# Patient Record
Sex: Female | Born: 1995 | Race: White | Hispanic: No | Smoking: Former smoker
Health system: Southern US, Community
[De-identification: ages and names within clinical notes are randomized; demographics above are authoritative.]

## PROBLEM LIST (undated history)

## (undated) DIAGNOSIS — G4730 Sleep apnea, unspecified: Secondary | ICD-10-CM

## (undated) DIAGNOSIS — N926 Irregular menstruation, unspecified: Principal | ICD-10-CM

## (undated) HISTORY — DX: Sleep apnea, unspecified: G47.30

## (undated) HISTORY — PX: ABDOMINAL SURGERY: SHX537

## (undated) HISTORY — PX: BREAST SURGERY: SHX581

## (undated) HISTORY — PX: TONSILLECTOMY: SUR1361

## (undated) HISTORY — PX: APPENDECTOMY: SHX54

## (undated) HISTORY — DX: Irregular menstruation, unspecified: N92.6

## (undated) HISTORY — PX: NASAL SINUS SURGERY: SHX719

---
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
## 2013-01-06 ENCOUNTER — Ambulatory Visit: Payer: 59

## 2013-01-28 ENCOUNTER — Ambulatory Visit: Payer: 59 | Admitting: Family Medicine

## 2013-03-25 ENCOUNTER — Encounter: Payer: Self-pay | Admitting: Family Medicine

## 2013-03-25 ENCOUNTER — Ambulatory Visit (INDEPENDENT_AMBULATORY_CARE_PROVIDER_SITE_OTHER): Payer: 59 | Admitting: Family Medicine

## 2013-03-25 VITALS — BP 128/72 | Ht 70.0 in | Wt 133.0 lb

## 2013-03-25 DIAGNOSIS — R319 Hematuria, unspecified: Secondary | ICD-10-CM

## 2013-03-25 DIAGNOSIS — N200 Calculus of kidney: Secondary | ICD-10-CM

## 2013-03-25 LAB — POCT URINALYSIS DIPSTICK
PH UA: 6
Spec Grav, UA: 1.01

## 2013-03-25 MED ORDER — ONDANSETRON 4 MG PO TBDP: 4.0000 mg | ORAL_TABLET | Freq: Three times a day (TID) | ORAL | Status: DC | PRN

## 2013-03-25 MED ORDER — CIPROFLOXACIN HCL 500 MG PO TABS: 500.0000 mg | ORAL_TABLET | Freq: Two times a day (BID) | ORAL | Status: DC

## 2013-03-25 MED ORDER — HYDROCODONE-ACETAMINOPHEN 5-325 MG PO TABS: 1.0000 | ORAL_TABLET | ORAL | Status: DC | PRN

## 2013-03-25 MED ORDER — DICLOFENAC SODIUM 75 MG PO TBEC: 75.0000 mg | DELAYED_RELEASE_TABLET | Freq: Two times a day (BID) | ORAL | Status: DC

## 2013-03-25 NOTE — Progress Notes (Signed)
   Subjective:    Patient ID: Sheila Fields, female    DOB: 06/06/1995, 18 y.o.   MRN: [id number]  HPI Patient arrives with complaint of pelvic pain for about a week - worse yesterday with hematuria. Patient has history of kidney stone and states it felt like the pain she is currently experiencing.  Had prior kid stone--this feels a lot like it patient had history of prior stone. She never required surgery or interventions before. That was on her left side. Was told at that time scan refilled the stone on the right side.  Review full records reveals old scar scan refilled 3 mm stone at right kidney region.  Some increase frequency possible slight chills right flank pain.  Nausea at times with the pain. At times the pain is severe.  No fam hx  Comm college this fall,  Review of Systems No high fevers no frank vomiting no chills no abdominal bowel changes ROS otherwise negative    Objective:   Physical Exam  Alert moderate malaise vitals stable HEENT normal right CVA tenderness lungs clear. Heart regular in rhythm. Abdomen good bowel sounds right lateral and lower abdominal tenderness to deep palpation  Urinalysis too numerous to count red blood cells and occasional white blood cell.      Assessment & Plan:  Impression probable right renal stone in ureter with possible secondary infection discussed at length. Hold off a mega-workup for now. Rationale discussed. Plan hydrocodone when necessary for pain. Cipro twice a day as prescribed. Zofran when necessary. Increase liquids. Screening urine bring in stone if available. WSL

## 2013-03-27 DIAGNOSIS — N200 Calculus of kidney: Secondary | ICD-10-CM | POA: Insufficient documentation

## 2013-03-27 DIAGNOSIS — R319 Hematuria, unspecified: Secondary | ICD-10-CM | POA: Insufficient documentation

## 2013-04-19 ENCOUNTER — Telehealth: Payer: Self-pay | Admitting: *Deleted

## 2013-04-19 DIAGNOSIS — N200 Calculus of kidney: Secondary | ICD-10-CM

## 2013-04-19 NOTE — Telephone Encounter (Signed)
Patient dropped off kidney stone. Do you want to send to lab. She was seen on 03/25/13 for kidney stone.

## 2013-04-19 NOTE — Telephone Encounter (Signed)
Sent to lab

## 2013-04-19 NOTE — Telephone Encounter (Signed)
yes

## 2013-04-23 LAB — STONE ANALYSIS: Stone Weight KSTONE: 0.034 g

## 2013-04-27 NOTE — Telephone Encounter (Signed)
Patient notified and verbalized understanding. 

## 2013-05-14 ENCOUNTER — Encounter: Payer: Self-pay | Admitting: *Deleted

## 2014-01-09 ENCOUNTER — Encounter: Payer: Self-pay | Admitting: *Deleted

## 2014-04-18 IMAGING — CT CT ABD-PELV W/O CM
2 of 4 series · 17 of 46 positions shown, 19 images · non-contrast
Comparison: 11/29/2007

CLINICAL DATA: Left flank pain for 4 days, constipation, nausea

CT ABDOMEN AND PELVIS WITHOUT CONTRAST
TECHNIQUE: Multidetector CT imaging of the abdomen and pelvis was
performed following the standard protocol without intravenous
contrast. Sagittal and coronal MPR images reconstructed from axial
data set.

[Series 2: stone 130 5.0 b31f st · axial · 0.62mm/px · z∈[-436,-66]mm · 14 of 80 slices shown, 16 images]
[im 3/80  soft-tissue]
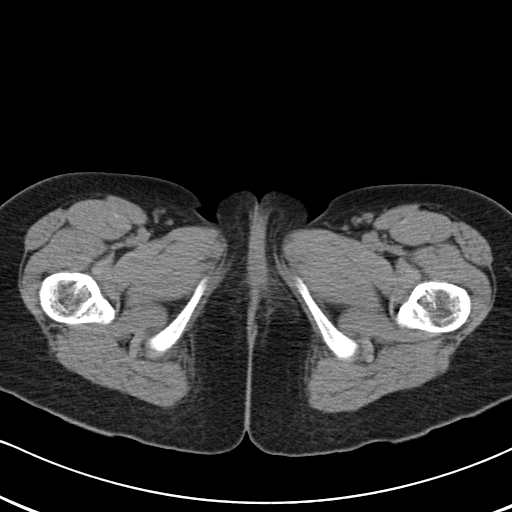
[im 3/80  bone]
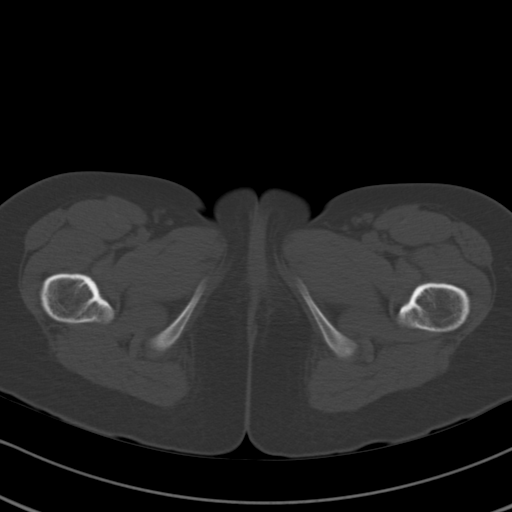
[im 9/80  soft-tissue]
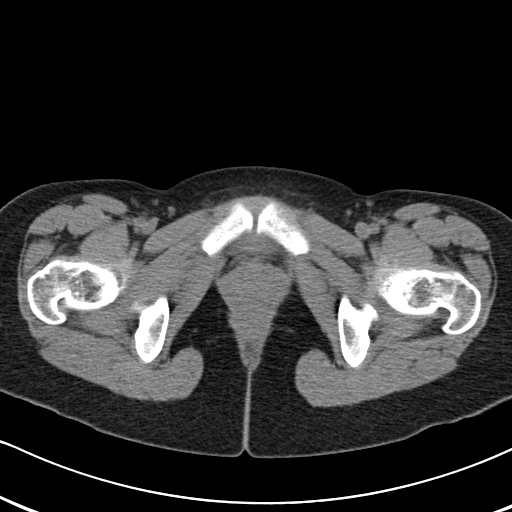
[im 15/80  soft-tissue]
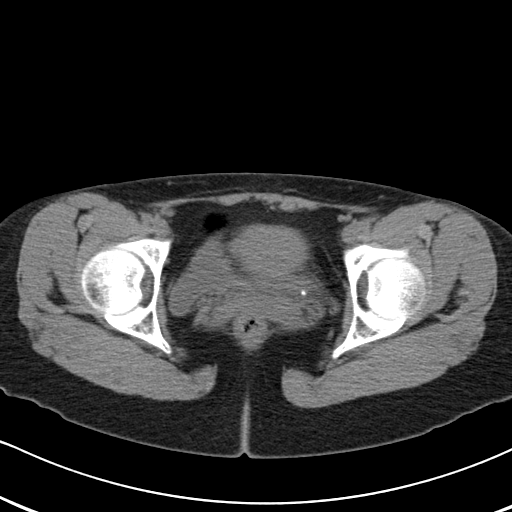
[im 21/80  soft-tissue]
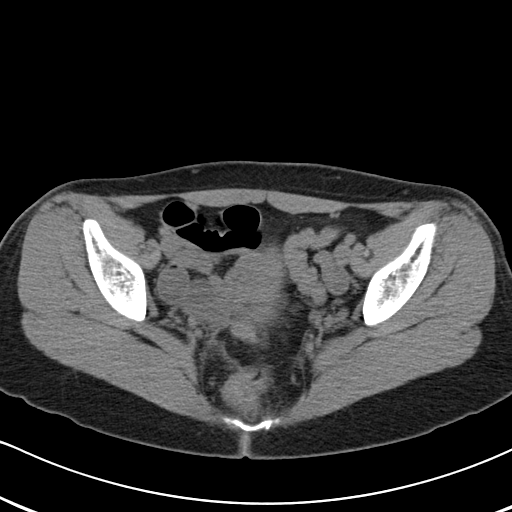
[im 27/80  soft-tissue]
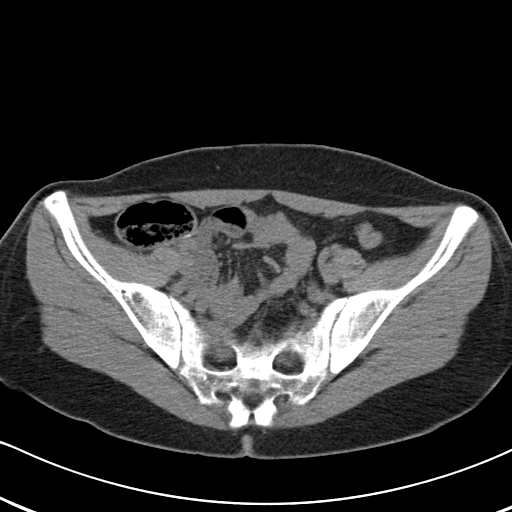
[im 33/80  soft-tissue]
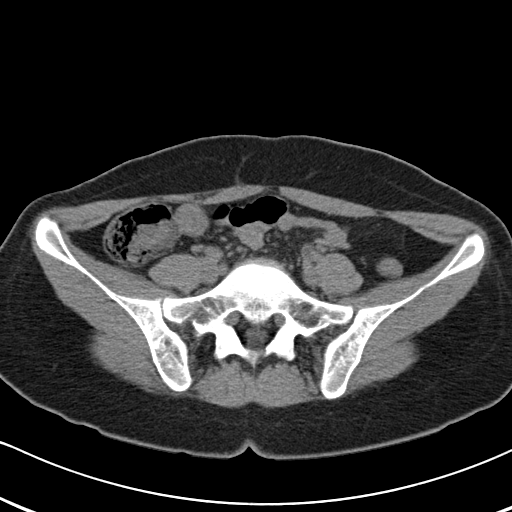
[im 39/80  soft-tissue]
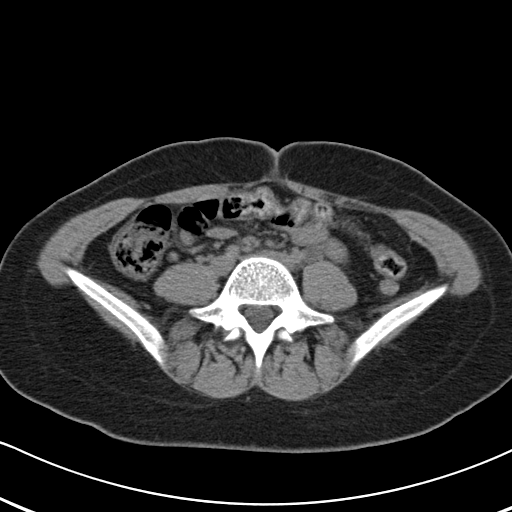
[im 41/80  soft-tissue]
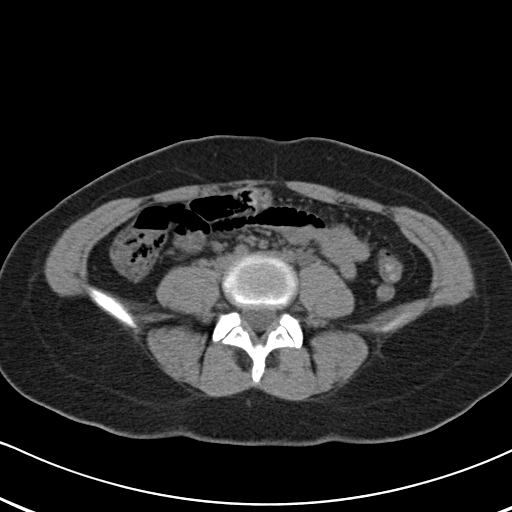
[im 47/80  soft-tissue]
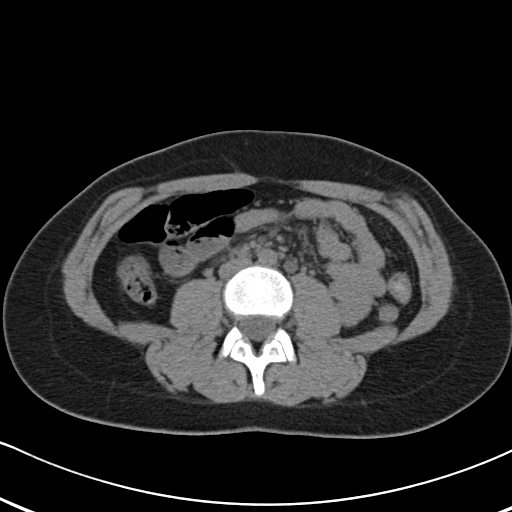
[im 47/80  bone]
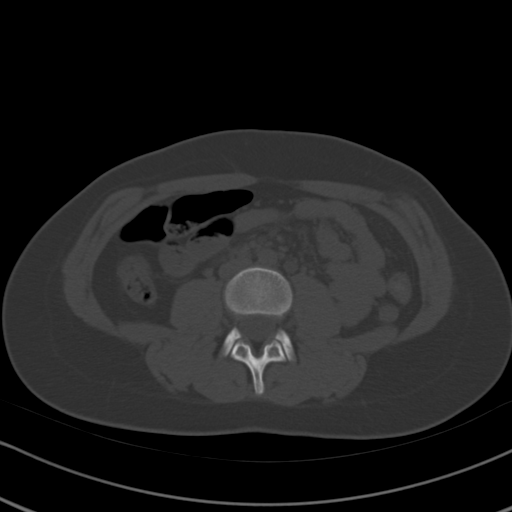
[im 53/80  soft-tissue]
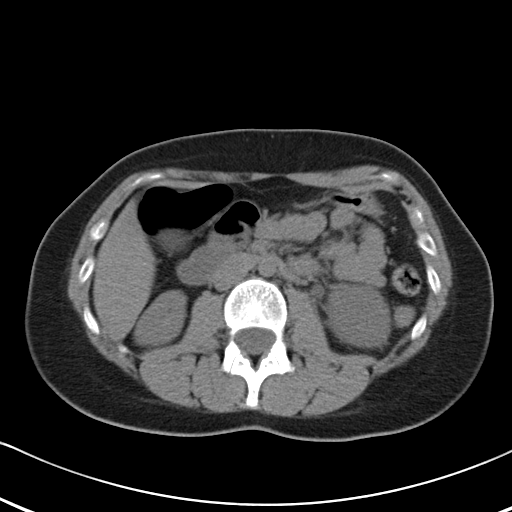
[im 59/80  soft-tissue]
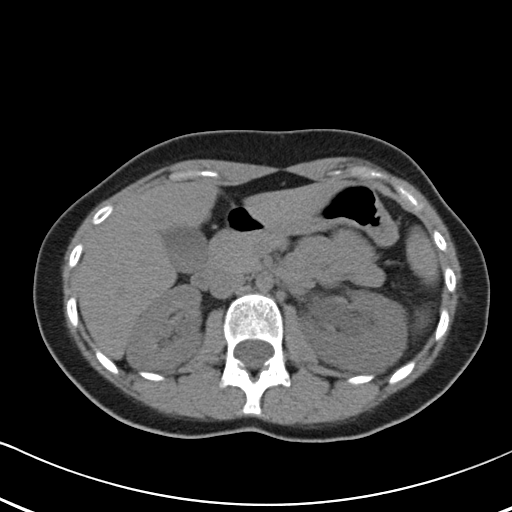
[im 65/80  soft-tissue]
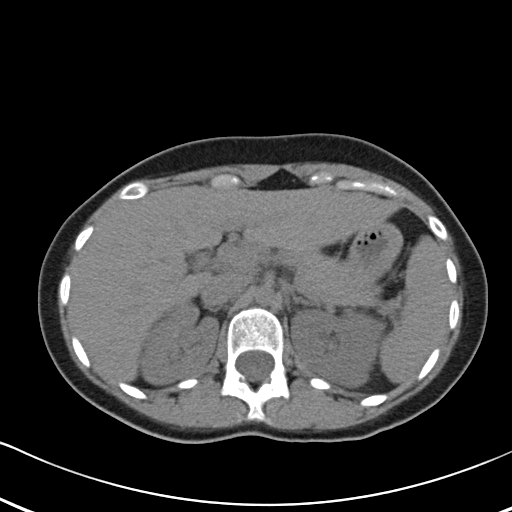
[im 71/80  soft-tissue]
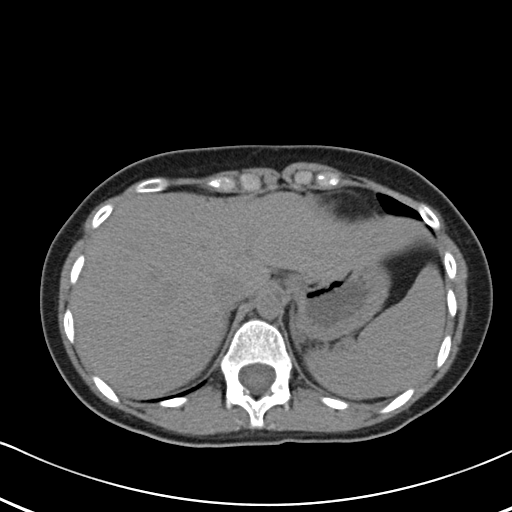
[im 77/80  soft-tissue]
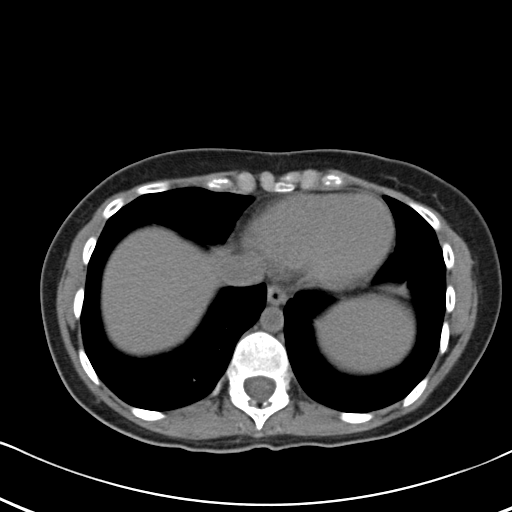

[Series 602: cor · coronal · 0.77mm/px · 3 of 51 slices shown]
[im 17/51  soft-tissue]
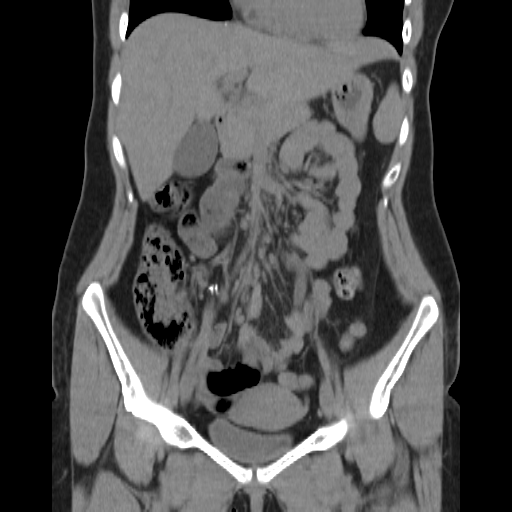
[im 23/51  soft-tissue]
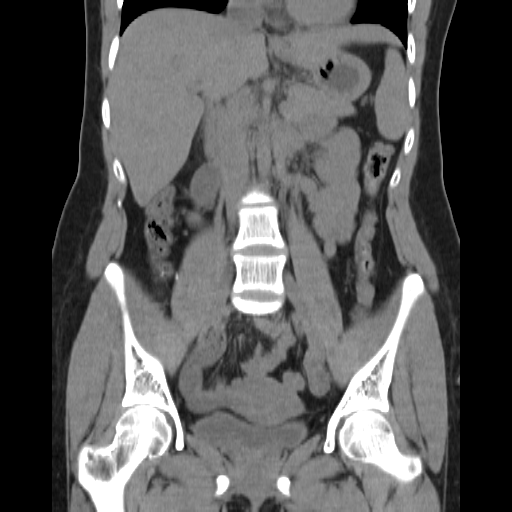
[im 28/51  soft-tissue]
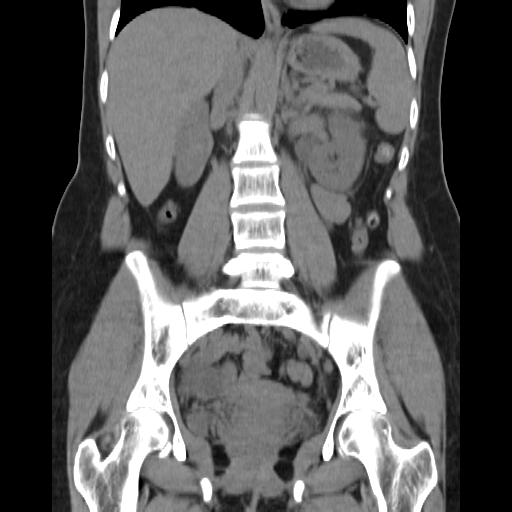

[17 of 46 positions shown; findings below may reference images not displayed]

FINDINGS: Lung bases clear.
Left hydronephrosis and hydroureter terminating at a 2 mm distal
left ureteral calculus located above the ureterovesicle junction
image 66.
Additional 3 mm nonobstructing calculus upper pole right kidney.
Within limits of a nonenhanced exam, no focal abnormalities of the
liver, spleen, pancreas, or adrenal glands identified.
Appendix surgically absent by history.
Bladder, uterus and adnexae unremarkable.
Stomach and bowel loops grossly normal appearance for technique.
No mass, adenopathy, free fluid or inflammatory process.
Scattered normal-sized mesenteric lymph nodes identified.
No hernia or acute bony lesion.
IMPRESSION: Left hydronephrosis and hydroureter secondary to a 2 mm distal left
ureteral calculus.
Additional nonobstructing 3 mm right renal calculus.

## 2014-07-18 ENCOUNTER — Ambulatory Visit (INDEPENDENT_AMBULATORY_CARE_PROVIDER_SITE_OTHER): Payer: 59 | Admitting: *Deleted

## 2014-07-18 DIAGNOSIS — Z111 Encounter for screening for respiratory tuberculosis: Secondary | ICD-10-CM | POA: Diagnosis not present

## 2014-07-20 LAB — TB SKIN TEST
INDURATION: 0 mm
TB SKIN TEST: NEGATIVE

## 2015-05-04 ENCOUNTER — Ambulatory Visit (INDEPENDENT_AMBULATORY_CARE_PROVIDER_SITE_OTHER): Payer: Managed Care, Other (non HMO) | Admitting: Family Medicine

## 2015-05-04 VITALS — BP 110/70 | Temp 98.4°F | Ht 70.0 in | Wt 102.0 lb

## 2015-05-04 DIAGNOSIS — N10 Acute tubulo-interstitial nephritis: Secondary | ICD-10-CM

## 2015-05-04 DIAGNOSIS — R309 Painful micturition, unspecified: Secondary | ICD-10-CM

## 2015-05-04 LAB — POCT URINALYSIS DIPSTICK
PH UA: 6
Spec Grav, UA: 1.025
Urobilinogen, UA: >=8

## 2015-05-04 MED ORDER — CIPROFLOXACIN HCL 500 MG PO TABS: 500.0000 mg | ORAL_TABLET | Freq: Two times a day (BID) | ORAL | Status: AC

## 2015-05-04 MED ORDER — IBUPROFEN 800 MG PO TABS: 800.0000 mg | ORAL_TABLET | Freq: Three times a day (TID) | ORAL | Status: DC | PRN

## 2015-05-04 NOTE — Progress Notes (Signed)
   Subjective:    Patient ID: Sheila Fields, female    DOB: 1996-03-01, 20 y.o.   MRN: [id number]  Urinary Tract Infection  This is a new problem. The current episode started 1 to 4 weeks ago. The problem occurs intermittently. The problem has been gradually worsening. The quality of the pain is described as aching. There has been no fever. Associated symptoms comments: Painful urination, Lower back pain  . She has tried nothing for the symptoms.   Patient states no other concern this visit.  Results for orders placed or performed in visit on 05/04/15  POCT urinalysis dipstick  Result Value Ref Range   Color, UA Dark yellow/red    Clarity, UA Cloudy    Glucose, UA     Bilirubin, UA     Ketones, UA     Spec Grav, UA 1.025    Blood, UA     pH, UA 6.0    Protein, UA     Urobilinogen, UA >=8.0    Nitrite, UA     Leukocytes, UA large (3+) (A) Negative   Hx of kidney stones  Right flank painful, pt thought may ve a kidney stone  No high fever or chills  Pos dysuria and , right flan also, some sciatic nerve tiype pain   Review of Systems No chest pain no cough no high fevers no major abdominal pain    Objective:   Physical Exam  Alert vitals stable no apparent distress lungs clear. Heart regular rhythm right CVA tenderness      Assessment & Plan:  Impression urinary tract infection with potential early pyelonephritis. Positive history of kidney stones also plan antibiotics prescribed. Urine culture done symptom care discussed WSL

## 2015-05-06 LAB — URINE CULTURE

## 2016-07-11 ENCOUNTER — Encounter: Payer: Managed Care, Other (non HMO) | Admitting: Pediatrics

## 2016-09-03 ENCOUNTER — Encounter: Payer: Self-pay | Admitting: Nurse Practitioner

## 2016-09-03 ENCOUNTER — Ambulatory Visit (INDEPENDENT_AMBULATORY_CARE_PROVIDER_SITE_OTHER): Payer: 59 | Admitting: Nurse Practitioner

## 2016-09-03 VITALS — BP 108/68 | Temp 98.6°F | Ht 70.0 in | Wt 149.0 lb

## 2016-09-03 DIAGNOSIS — K047 Periapical abscess without sinus: Secondary | ICD-10-CM | POA: Diagnosis not present

## 2016-09-03 DIAGNOSIS — J0100 Acute maxillary sinusitis, unspecified: Secondary | ICD-10-CM | POA: Diagnosis not present

## 2016-09-03 MED ORDER — AMOXICILLIN-POT CLAVULANATE 875-125 MG PO TABS: 1.0000 | ORAL_TABLET | Freq: Two times a day (BID) | ORAL | 0 refills | Status: DC

## 2016-09-04 ENCOUNTER — Encounter: Payer: Self-pay | Admitting: Nurse Practitioner

## 2016-09-04 NOTE — Progress Notes (Signed)
Subjective:  Presents for c/o swelling and pain in the right facial area. Began on 6/25. Has a problem with a tooth on the right side. Has appointment with dentist tomorrow. Swelling has improved. Low grade fever max temp 99.9. Slight sore throat. Right sided headache. Runny nose. Green mucus. No cough or wheezing. Right ear pain. Breastfeeding.   Objective:   BP 108/68   Temp 98.6 F (37 C) (Oral)   Ht 5\' 10"  (1.778 m)   Wt 149 lb (67.6 kg)   BMI 21.38 kg/m  NAD. Alert, oriented. TMs clear effusion, no erythema. Pharynx non erythematous with PND noted. Significant decay in right upper back tooth. Mild tenderness noted. Mild edema right mid to lower face, no erythema. Neck supple with mild tender adenopathy. Lungs clear. Heart RRR.   Assessment:  Acute non-recurrent maxillary sinusitis  Infected tooth    Plan:   Meds ordered this encounter  Medications  . Prenatal Vit-Fe Fumarate-FA (PRENATAL MULTIVITAMIN) TABS tablet    Sig: Take 1 tablet by mouth daily at 12 noon.  Marland Kitchen. amoxicillin-clavulanate (AUGMENTIN) 875-125 MG tablet    Sig: Take 1 tablet by mouth 2 (two) times daily.    Dispense:  20 tablet    Refill:  0    Order Specific Question:   Supervising Provider    Answer:   Merlyn AlbertLUKING, WILLIAM S [[id number]]   Follow up with dentist as planned. Ibuprofen as directed for pain.  Call back if worsens or persists.

## 2017-07-15 ENCOUNTER — Encounter: Payer: Self-pay | Admitting: Family Medicine

## 2017-07-15 ENCOUNTER — Ambulatory Visit (INDEPENDENT_AMBULATORY_CARE_PROVIDER_SITE_OTHER): Payer: 59 | Admitting: Family Medicine

## 2017-07-15 VITALS — BP 122/74 | Temp 98.3°F | Ht 70.0 in | Wt 135.0 lb

## 2017-07-15 DIAGNOSIS — Z30019 Encounter for initial prescription of contraceptives, unspecified: Secondary | ICD-10-CM

## 2017-07-15 MED ORDER — NORETHIN ACE-ETH ESTRAD-FE 1-20 MG-MCG(24) PO CHEW
1.0000 | CHEWABLE_TABLET | Freq: Every day | ORAL | 11 refills | Status: DC
Start: 2017-07-15 — End: 2018-06-29

## 2017-07-15 NOTE — Progress Notes (Signed)
   Subjective:    Patient ID: Sheila Fields, female    DOB: [birth year], 22 y.o.   MRN: [id number]  HPIpt just finished breast feeding. obgyn told pt when she was finished breast feeding that she would need to switch to a different birth control pill. Taking Arna Medici -be currently.   In the past was on the depo   No hx of pap smear abnormalitie s  Patient no longer wants to be on that though.  Patient has taken birth control pills in the past.  Currently on progesterone only component due to recent breast-feeding.  Now has stopped breast-feeding.  Would like to  Go to more of a balance birth control pill     Review of Systems No headache, no major weight loss or weight gain, no chest pain no back pain abdominal pain no change in bowel habits complete ROS otherwise negative     Objective:   Physical Exam Alert vitals stable, NAD. Blood pressure good on repeat. HEENT normal. Lungs clear. Heart regular rate and rhythm.   Impression birth control discussion.  Pros and cons discussed.  Will need to do estrogen progesterone combination       Assessment & Plan:  1 tablet.  Proper use discussed

## 2017-09-07 ENCOUNTER — Other Ambulatory Visit: Payer: Self-pay

## 2017-09-07 ENCOUNTER — Emergency Department (HOSPITAL_COMMUNITY)
Admission: EM | Admit: 2017-09-07 | Discharge: 2017-09-07 | Disposition: A | Discharge status: Home / Self Care | Payer: 59 | Attending: Emergency Medicine | Admitting: Emergency Medicine

## 2017-09-07 ENCOUNTER — Encounter (HOSPITAL_COMMUNITY): Payer: Self-pay | Admitting: Emergency Medicine

## 2017-09-07 DIAGNOSIS — R112 Nausea with vomiting, unspecified: Secondary | ICD-10-CM

## 2017-09-07 DIAGNOSIS — N390 Urinary tract infection, site not specified: Secondary | ICD-10-CM | POA: Insufficient documentation

## 2017-09-07 DIAGNOSIS — F17210 Nicotine dependence, cigarettes, uncomplicated: Secondary | ICD-10-CM | POA: Diagnosis not present

## 2017-09-07 DIAGNOSIS — Z79899 Other long term (current) drug therapy: Secondary | ICD-10-CM | POA: Diagnosis not present

## 2017-09-07 DIAGNOSIS — A029 Salmonella infection, unspecified: Secondary | ICD-10-CM | POA: Insufficient documentation

## 2017-09-07 DIAGNOSIS — R197 Diarrhea, unspecified: Secondary | ICD-10-CM

## 2017-09-07 LAB — URINALYSIS, ROUTINE W REFLEX MICROSCOPIC
BILIRUBIN URINE: NEGATIVE
Glucose, UA: NEGATIVE mg/dL
KETONES UR: NEGATIVE mg/dL
NITRITE: POSITIVE — AB
Protein, ur: 30 mg/dL — AB
SPECIFIC GRAVITY, URINE: 1.026 (ref 1.005–1.030)
pH: 5 (ref 5.0–8.0)

## 2017-09-07 LAB — BASIC METABOLIC PANEL
Anion gap: 7 (ref 5–15)
BUN: 7 mg/dL (ref 6–20)
CALCIUM: 9.3 mg/dL (ref 8.9–10.3)
CHLORIDE: 103 mmol/L (ref 98–111)
CO2: 28 mmol/L (ref 22–32)
Creatinine, Ser: 0.72 mg/dL (ref 0.44–1.00)
GFR calc non Af Amer: >60 mL/min (ref >60)
Glucose, Bld: 93 mg/dL (ref 70–99)
Potassium: 3.7 mmol/L (ref 3.5–5.1)
SODIUM: 138 mmol/L (ref 135–145)

## 2017-09-07 LAB — CBC WITH DIFFERENTIAL/PLATELET
BASOS PCT: 0 %
Basophils Absolute: 0 10*3/uL (ref 0.0–0.1)
EOS ABS: 0.1 10*3/uL (ref 0.0–0.7)
Eosinophils Relative: 1 %
HCT: 46.6 % — ABNORMAL HIGH (ref 36.0–46.0)
HEMOGLOBIN: 16 g/dL — AB (ref 12.0–15.0)
LYMPHS PCT: 12 %
Lymphs Abs: 1.2 10*3/uL (ref 0.7–4.0)
MCH: 32.3 pg (ref 26.0–34.0)
MCHC: 34.3 g/dL (ref 30.0–36.0)
MCV: 94 fL (ref 78.0–100.0)
Monocytes Absolute: 0.5 10*3/uL (ref 0.1–1.0)
Monocytes Relative: 5 %
NEUTROS ABS: 7.9 10*3/uL — AB (ref 1.7–7.7)
Neutrophils Relative %: 82 %
Platelets: 267 10*3/uL (ref 150–400)
RBC: 4.96 MIL/uL (ref 3.87–5.11)
RDW: 12.3 % (ref 11.5–15.5)
WBC: 9.6 10*3/uL (ref 4.0–10.5)

## 2017-09-07 LAB — PREGNANCY, URINE: PREG TEST UR: NEGATIVE

## 2017-09-07 MED ORDER — SULFAMETHOXAZOLE-TRIMETHOPRIM 800-160 MG PO TABS
1.0000 | ORAL_TABLET | Freq: Two times a day (BID) | ORAL | 0 refills | Status: AC
Start: 2017-09-07 — End: 2017-09-14

## 2017-09-07 MED ORDER — ONDANSETRON HCL 4 MG PO TABS: 4.0000 mg | ORAL_TABLET | Freq: Four times a day (QID) | ORAL | 0 refills | Status: DC

## 2017-09-07 MED ORDER — SODIUM CHLORIDE 0.9 % IV BOLUS
1000.0000 mL | Freq: Once | INTRAVENOUS | Status: AC
  Administered 2017-09-07: 1000 mL via INTRAVENOUS

## 2017-09-07 MED ORDER — KETOROLAC TROMETHAMINE 30 MG/ML IJ SOLN
30.0000 mg | Freq: Once | INTRAMUSCULAR | Status: AC
  Administered 2017-09-07: 30 mg via INTRAVENOUS
  Filled 2017-09-07: qty 1

## 2017-09-07 MED ORDER — LOPERAMIDE HCL 2 MG PO CAPS: 2.0000 mg | ORAL_CAPSULE | Freq: Four times a day (QID) | ORAL | 0 refills | Status: DC | PRN

## 2017-09-07 MED ORDER — LOPERAMIDE HCL 2 MG PO CAPS
4.0000 mg | ORAL_CAPSULE | Freq: Once | ORAL | Status: AC
  Administered 2017-09-07: 4 mg via ORAL
  Filled 2017-09-07: qty 2

## 2017-09-07 MED ORDER — ONDANSETRON HCL 4 MG/2ML IJ SOLN
4.0000 mg | Freq: Once | INTRAMUSCULAR | Status: AC
  Administered 2017-09-07: 4 mg via INTRAVENOUS
  Filled 2017-09-07: qty 2

## 2017-09-07 NOTE — Discharge Instructions (Addendum)
Your evaluated in the emergency department for a few days of nausea vomiting diarrhea with a low-grade fever.  Your testing was significant for having a urinary tract infection.  We also sent stool studies but will take a few days to result.  You are feeling better after some fluids and some medication.  We are prescribing you antibiotics to take along with the diarrhea medicine and some nausea medicine to use as needed.  Please follow-up with your doctor and return if any worsening symptoms.

## 2017-09-07 NOTE — ED Provider Notes (Signed)
[facility] EMERGENCY DEPARTMENT Provider Note   CSN: [id number] Arrival date & time: 09/07/17  1131     History   Chief Complaint Chief Complaint  Patient presents with  . Headache  . Emesis  . Generalized Body Aches    HPI Sheila Fields is a 22 y.o. female.  She is complaining of onset on Saturday of diarrhea multiple episodes green watery no associated blood along with headache vomiting body aches and low-grade fever to 100 maximum.  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  She also went down to the [facility] on Saturday but states she did not ingest any water.  There is been no unusual travel or otherwise different exposures or food.  She states her last menstrual period was on Saturday.  She is tried drinking some Sprite and taking some Kaopectate without any significant relief.  There is been no blood in the diarrhea no blood in her vomitus.  Is not associated with any particular abdominal pain.  The history is provided by the patient.  Diarrhea   This is a new problem. The current episode started 2 days ago. The problem occurs 5 to 10 times per day. The problem has not changed since onset.The stool consistency is described as watery. The maximum temperature recorded prior to her arrival was 100 to 100.9 F. The fever has been present for 1 to 2 days. Associated symptoms include vomiting, chills, sweats, headaches and myalgias. Pertinent negatives include no abdominal pain, no URI and no cough. She has tried [employer] for the symptoms. The treatment provided no relief.    Past Medical History:  Diagnosis Date  . Irregular bleeding 10/25/2012  . Sleep apnea     Patient Active Problem List   Diagnosis Date Noted  . Hematuria 03/27/2013  . Kidney stone 03/27/2013  . Irregular bleeding 10/25/2012    Past Surgical History:  Procedure Laterality Date  . ABDOMINAL SURGERY    . APPENDECTOMY    . BREAST SURGERY     breast cyst    . TONSILLECTOMY       OB History    Gravida  1   Para  1   Term      Preterm      AB      Living  1     SAB      TAB      Ectopic      Multiple      Live Births  1            Home Medications    Prior to Admission medications   Medication Sig Start Date End Date Taking? Authorizing Provider  Norethin Ace-Eth Estrad-FE (MINASTRIN 24 FE) 1-20 MG-MCG(24) CHEW Chew 1 tablet by mouth daily. 07/15/17   Merlyn Albert, MD    Family History Family History  Problem Relation Age of Onset  . Cancer Other        breast-great aunt  . Stroke Other        mat great gand ma  . Diabetes Maternal Grandfather   . Heart disease Maternal Grandfather        MI    Social History Social History   Tobacco Use  . Smoking status: Current Every Day Smoker    Packs/day: 0.10    Years: 1.00    Pack years: 0.10    Types: Cigarettes  . Smokeless tobacco: Never Used  . Tobacco  comment: smokes 3-4 cigs a day.  Substance Use Topics  . Alcohol use: No  . Drug use: No     Allergies   Patient has no known allergies.   Review of Systems Review of Systems  Constitutional: Positive for chills and fever.  HENT: Negative for rhinorrhea and sore throat.   Eyes: Negative for pain and visual disturbance.  Respiratory: Negative for cough and shortness of breath.   Cardiovascular: Negative for chest pain and palpitations.  Gastrointestinal: Positive for diarrhea and vomiting. Negative for abdominal pain.  Genitourinary: Negative for dysuria, frequency and vaginal discharge.  Musculoskeletal: Positive for myalgias.  Skin: Negative for rash.  Neurological: Positive for headaches. Negative for seizures.     Physical Exam Updated Vital Signs BP 120/60   Pulse 67   Temp 99.2 F (37.3 C) (Oral)   Resp 20   LMP 09/07/2017   SpO2 100%   Physical Exam  Constitutional: She is oriented to person, place, and time. She appears well-developed and well-nourished. No distress.   HENT:  Head: Normocephalic and atraumatic.  Eyes: Pupils are equal, round, and reactive to light. Conjunctivae and EOM are normal.  Neck: Normal range of motion. Neck supple. No Brudzinski's sign and no Kernig's sign noted.  Cardiovascular: Normal rate and regular rhythm.  No murmur heard. Pulmonary/Chest: Effort normal and breath sounds normal. No respiratory distress.  Abdominal: Soft. She exhibits no mass. There is no tenderness. There is no guarding.  Musculoskeletal: Normal range of motion. She exhibits no edema or tenderness.  Neurological: She is alert and oriented to person, place, and time. She has normal strength. GCS eye subscore is 4. GCS verbal subscore is 5. GCS motor subscore is 6.  Skin: Skin is warm and dry. Capillary refill takes less than 2 seconds.  Psychiatric: She has a normal mood and affect.  Nursing note and vitals reviewed.    ED Treatments / Results  Labs (all labs ordered are listed, but only abnormal results are displayed) Labs Reviewed  CBC WITH DIFFERENTIAL/PLATELET - Abnormal; Notable for the following components:      Result Value   Hemoglobin 16.0 (*)    HCT 46.6 (*)    Neutro Abs 7.9 (*)    All other components within normal limits  GASTROINTESTINAL PANEL BY PCR, STOOL (REPLACES STOOL CULTURE)  BASIC METABOLIC PANEL  URINALYSIS, ROUTINE W REFLEX MICROSCOPIC  PREGNANCY, URINE    EKG None  Radiology No results found.  Procedures Procedures (including critical care time)  Medications Ordered in ED Medications  sodium chloride 0.9 % bolus 1,000 mL (0 mLs Intravenous Stopped 09/07/17 1448)  ondansetron (ZOFRAN) injection 4 mg (4 mg Intravenous Given 09/07/17 1414)  loperamide (IMODIUM) capsule 4 mg (4 mg Oral Given 09/07/17 1413)  sodium chloride 0.9 % bolus 1,000 mL (0 mLs Intravenous Stopped 09/07/17 1555)  ketorolac (TORADOL) 30 MG/ML injection 30 mg (30 mg Intravenous Given 09/07/17 1457)     Initial Impression / Assessment and Plan / ED  Course  I have reviewed the triage vital signs and the nursing notes.  Pertinent labs & imaging results that were available during my care of the patient were reviewed by me and considered in my medical decision making (see chart for details).  Clinical Course as of Sep 09 1026  Mon Sep 07, 2017  [age] year old female with no significant medical history here with headache nausea vomiting diarrhea.  She looks like she does not feel well but is not toxic in appearance  and her vital signs are normal.  Her labs are just coming back and skin a normal white count normal chemistries.  Urine is nitrate positive with 21-50 white cells so she possibly has a urine infection in the setting of this gastroenteritis symptoms.  We are giving her some nausea medicine IV fluids antidiarrheals and will need to start on some antibiotics too.    [MB]  1447 Reviewed labs with patient she says she has not had any urine symptoms other than not being as much because she felt so dehydrated.  It is reasonable that we start her on some antibiotics with her nitrite positive urine although that would not account for her diarrhea.  Ordering a second liter of fluids and some Toradol now that we know what her renal function is and she is pregnancy negative.   [MB]  1527 Patient feels better and has been tolerating some p.o.  she is comfortable going home and will return if any problems   [MB]    Clinical Course User Index [MB] Terrilee Files, MD    Final Clinical Impressions(s) / ED Diagnoses   Final diagnoses:  Lower urinary tract infectious disease  Nausea vomiting and diarrhea    ED Discharge Orders        Ordered    loperamide (IMODIUM) 2 MG capsule  4 times daily PRN     09/07/17 1523    ondansetron (ZOFRAN) 4 MG tablet  Every 6 hours     09/07/17 1523    sulfamethoxazole-trimethoprim (BACTRIM DS,SEPTRA DS) 800-160 MG tablet  2 times daily     09/07/17 1523       Terrilee Files, MD 09/08/17 1029

## 2017-09-07 NOTE — ED Triage Notes (Addendum)
Pt c/o h/a, vomiting/fever/diarrhea and body aches x 2 days.  Mm moist.

## 2017-09-08 LAB — GASTROINTESTINAL PANEL BY PCR, STOOL (REPLACES STOOL CULTURE)

## 2017-09-08 NOTE — ED Notes (Signed)
Advised patient of positive samonella result. Patient states she is taking Bactrim. Advised to return if bloody stools are noted.

## 2017-09-10 LAB — URINE CULTURE
Culture: >=100000 — AB
SPECIAL REQUESTS: NORMAL

## 2017-09-11 ENCOUNTER — Telehealth: Payer: Self-pay

## 2017-09-11 NOTE — Telephone Encounter (Signed)
Post ED Visit - Positive Culture Follow-up  Culture report reviewed by antimicrobial stewardship pharmacist:  [x]  Enzo BiNathan Batchelder, Pharm.D. []  Celedonio MiyamotoJeremy Frens, Pharm.D., BCPS AQ-ID []  Garvin FilaMike Maccia, Pharm.D., BCPS []  Georgina PillionElizabeth Martin, [address]Ph[address] BCPS []  DurhamMinh Pham, [address]Pharm[address]D., BCPS, AAHIVP []  Estella HuskMichelle Turner, Pharm.D., BCPS, AAHIVP []  Lysle Pearlachel Rumbarger, PharmD, BCPS []  Phillips Climeshuy Dang, PharmD, BCPS []  Agapito GamesAlison Masters, PharmD, BCPS []  Verlan FriendsErin Deja, PharmD  Positive urine culture Treated with Sulfamethoxazole-Trimethoprim, organism sensitive to the same and no further patient follow-up is required at this time.  Jerry CarasCullom, Haadi Santellan Burnett 09/11/2017, 9:56 AM

## 2017-09-30 ENCOUNTER — Telehealth: Payer: Self-pay | Admitting: Family Medicine

## 2017-09-30 MED ORDER — FLUCONAZOLE 150 MG PO TABS: ORAL_TABLET | ORAL | 1 refills | Status: DC

## 2017-09-30 NOTE — Telephone Encounter (Signed)
Pt was in the ER on 09/07/17. She was placed on an antibiotic and now thinks she has a yeast infection and [phone] - [place], [place] - [address]

## 2017-09-30 NOTE — Telephone Encounter (Signed)
Per pt yes she has itching and burning and discharge,she has had yeast infection in the past and thinks this is what she has going on. I escribed the diflucan for the patient and instructed if no better in a few days call us back.

## 2017-09-30 NOTE — Telephone Encounter (Signed)
thx

## 2018-06-29 ENCOUNTER — Other Ambulatory Visit: Payer: Self-pay | Admitting: Family Medicine

## 2018-06-29 NOTE — Telephone Encounter (Signed)
One mo, sched virt visit before this mo over

## 2018-06-29 NOTE — Telephone Encounter (Signed)
Patient scheduled virtual visit with Dr Brett Canales

## 2018-07-01 ENCOUNTER — Encounter: Payer: Self-pay | Admitting: Family Medicine

## 2018-07-01 ENCOUNTER — Ambulatory Visit (INDEPENDENT_AMBULATORY_CARE_PROVIDER_SITE_OTHER): Payer: 59 | Admitting: Family Medicine

## 2018-07-01 ENCOUNTER — Other Ambulatory Visit: Payer: Self-pay

## 2018-07-01 VITALS — Wt 106.0 lb

## 2018-07-01 DIAGNOSIS — Z30019 Encounter for initial prescription of contraceptives, unspecified: Secondary | ICD-10-CM | POA: Diagnosis not present

## 2018-07-01 MED ORDER — NORETHIN ACE-ETH ESTRAD-FE 1-20 MG-MCG(24) PO CHEW: CHEWABLE_TABLET | ORAL | 11 refills | Status: DC

## 2018-07-01 NOTE — Progress Notes (Signed)
   Subjective:    Patient ID: Sheila Fields, female    DOB: [birth year], 23 y.o.   MRN: [id number] Audio plus visual HPI Pt here for refills on birth control refills. Pt last seen 07/15/17. Pt is currently taking Norethrin Ace-Eth-Estrad-FE chewable tablet. Pt states she is doing well on this med with no complications.   Virtual Visit via Video Note  I connected with Sheila Fields on 07/01/18 at  9:30 AM EDT by a video enabled telemedicine application and verified that I am speaking with the correct person using two identifiers.   I discussed the limitations of evaluation and management by telemedicine and the availability of in person appointments. The patient expressed understanding and agreed to proceed.  History of Present Illness:    Observations/Objective:   Assessment and Plan:   Follow Up Instructions:    I discussed the assessment and treatment plan with the patient. The patient was provided an opportunity to ask questions and all were answered. The patient agreed with the plan and demonstrated an understanding of the instructions.   The patient was advised to call back or seek an in-person evaluation if the symptoms worsen or if the condition fails to improve as anticipated.  I provided 15 minutes of non-face-to-face time during this encounter.   Marlowe Shores, LPN    Review of Systems No headache, no major weight loss or weight gain, no chest pain no back pain abdominal pain no change in bowel habits complete ROS otherwise negative     Objective:   Physical Exam   Virtual visit     Assessment & Plan:  Impression birth control counseling.  Multiple options discussed.  Patient's current approach working well.  No major side effects.  Benefits of maintaining discussed will maintain and write 1 years worth.  Questions answered  Greater than 50% of this 15 minute face to face visit was spent in counseling and discussion and coordination of care regarding  the above diagnosis/diagnosies

## 2018-07-28 ENCOUNTER — Other Ambulatory Visit: Payer: Self-pay | Admitting: Family Medicine

## 2018-09-06 ENCOUNTER — Other Ambulatory Visit: Payer: Self-pay

## 2018-09-06 ENCOUNTER — Encounter: Payer: Self-pay | Admitting: Family Medicine

## 2018-09-06 ENCOUNTER — Ambulatory Visit (INDEPENDENT_AMBULATORY_CARE_PROVIDER_SITE_OTHER): Payer: 59 | Admitting: Family Medicine

## 2018-09-06 DIAGNOSIS — F329 Major depressive disorder, single episode, unspecified: Secondary | ICD-10-CM

## 2018-09-06 DIAGNOSIS — F32A Depression, unspecified: Secondary | ICD-10-CM

## 2018-09-06 MED ORDER — ESCITALOPRAM OXALATE 20 MG PO TABS: ORAL_TABLET | ORAL | 3 refills | Status: DC

## 2018-09-06 NOTE — Progress Notes (Signed)
   Subjective:    Patient ID: Sheila Fields, female    DOB: 05-07-1995, 23 y.o.   MRN: [id number] Audio plus video Depression        This is a new problem.  Episode onset: one month.   Associated symptoms include decreased concentration, fatigue and irritable. pt states she does sometimes have thoughts of hurting herself.   Virtual Visit via Video Note  I connected with Sheila Fields on 09/06/18 at 11:00 AM EDT by a video enabled telemedicine application and verified that I am speaking with the correct person using two identifiers.  Location: Patient: home Provider: office   I discussed the limitations of evaluation and management by telemedicine and the availability of in person appointments. The patient expressed understanding and agreed to proceed.  History of Present Illness:    Observations/Objective:   Assessment and Plan:   Follow Up Instructions:    I discussed the assessment and treatment plan with the patient. The patient was provided an opportunity to ask questions and all were answered. The patient agreed with the plan and demonstrated an understanding of the instructions.   The patient was advised to call back or seek an in-person evaluation if the symptoms worsen or if the condition fails to improve as anticipated.  I provided  57minutes of non-face-to-face time during this encounter.   Patient notes periods of depression and anxiety ongoing since birth of last child nearly 2 years ago.  Worse in recent months.  Mostly depression.  Feeling down.  Diminished energy.  Feeling sad at times.  Notes irritability intermittently.  No active suicidal thoughts.  Just occasional "what is the use thoughts.  Strong family history of depression and anxiety on both sides of the family.  Patient motivated to try to get better   Review of Systems  Constitutional: Positive for fatigue.  Psychiatric/Behavioral: Positive for decreased concentration and depression.       Objective:   Physical Exam Constitutional:      General: She is irritable.           Assessment & Plan:  Impression moderate depression.  Discussed at length.  Longstanding.  Strong family history.  Patient reluctant to meds now willing.  Has led to tendency to drink more in the evening time.  She does not like this.  We will work on counseling.  Plus initiate Lexapro.  Side effects benefits discussed.  Follow-up in 1 month.  Warning signs discussed carefully about potential exacerbations with the medication

## 2018-09-15 ENCOUNTER — Encounter: Payer: Self-pay | Admitting: Family Medicine

## 2018-12-01 NOTE — Progress Notes (Deleted)
Psychiatric Initial Adult Assessment   Patient Identification: Sheila Fields MRN:  [id number] Date of Evaluation:  12/01/2018 Referral Source: Mikey Kirschner, MD Chief Complaint:   Visit Diagnosis: No diagnosis found.  History of Present Illness:   Sheila Fields is a 23 y.o. year old female with a history of depression, who is referred for depression.      Associated Signs/Symptoms: Depression Symptoms:  {DEPRESSION SYMPTOMS:20000} (Hypo) Manic Symptoms:  {BHH MANIC SYMPTOMS:22872} Anxiety Symptoms:  {BHH ANXIETY SYMPTOMS:22873} Psychotic Symptoms:  {BHH PSYCHOTIC SYMPTOMS:22874} PTSD Symptoms: {BHH PTSD SYMPTOMS:22875}  Past Psychiatric History:  Outpatient:  Psychiatry admission:  Previous suicide attempt:  Past trials of medication:  History of violence:   Previous Psychotropic Medications: {YES/NO:21197}  Substance Abuse History in the last 12 months:  {yes no:314532}  Consequences of Substance Abuse: {BHH CONSEQUENCES OF SUBSTANCE ABUSE:22880}  Past Medical History:  Past Medical History:  Diagnosis Date  . Irregular bleeding 10/25/2012  . Sleep apnea     Past Surgical History:  Procedure Laterality Date  . ABDOMINAL SURGERY    . APPENDECTOMY    . BREAST SURGERY     breast cyst  . TONSILLECTOMY      Family Psychiatric History: ***  Family History:  Family History  Problem Relation Age of Onset  . Cancer Other        breast-great aunt  . Stroke Other        mat great gand ma  . Diabetes Maternal Grandfather   . Heart disease Maternal Grandfather        MI    Social History:   Social History   Socioeconomic History  . Marital status: Single    Spouse name: Not on file  . Number of children: Not on file  . Years of education: Not on file  . Highest education level: Not on file  Occupational History  . Not on file  Social Needs  . Financial resource strain: Not on file  . Food insecurity    Worry: Not on file    Inability:  Not on file  . Transportation needs    Medical: Not on file    Non-medical: Not on file  Tobacco Use  . Smoking status: Current Every Day Smoker    Packs/day: 0.10    Years: 1.00    Pack years: 0.10    Types: Cigarettes  . Smokeless tobacco: Never Used  . Tobacco comment: smokes 3-4 cigs a day.  Substance and Sexual Activity  . Alcohol use: No  . Drug use: No  . Sexual activity: Yes    Birth control/protection: Pill  Lifestyle  . Physical activity    Days per week: Not on file    Minutes per session: Not on file  . Stress: Not on file  Relationships  . Social Herbalist on phone: Not on file    Gets together: Not on file    Attends religious service: Not on file    Active member of club or organization: Not on file    Attends meetings of clubs or organizations: Not on file    Relationship status: Not on file  Other Topics Concern  . Not on file  Social History Narrative  . Not on file    Additional Social History: ***  Allergies:  No Known Allergies  Metabolic Disorder Labs: No results found for: HGBA1C, MPG No results found for: PROLACTIN No results found for: CHOL, TRIG, HDL, CHOLHDL, VLDL, LDLCALC  No results found for: TSH  Therapeutic Level Labs: No results found for: LITHIUM No results found for: CBMZ No results found for: VALPROATE  Current Medications: Current Outpatient Medications  Medication Sig Dispense Refill  . escitalopram (LEXAPRO) 20 MG tablet Take one half tablet qam for 7 days then go to one tablet qam. 30 tablet 3  . fluconazole (DIFLUCAN) 150 MG tablet Take one po three days apart. (Patient not taking: Reported on 07/01/2018) 1 tablet 1  . ibuprofen (ADVIL,MOTRIN) 400 MG tablet Take 400 mg by mouth every 6 (six) hours as needed.    . loperamide (IMODIUM) 2 MG capsule Take 1 capsule (2 mg total) by mouth 4 (four) times daily as needed for diarrhea or loose stools. (Patient not taking: Reported on 07/01/2018) 12 capsule 0  . Norethin  Ace-Eth Estrad-FE 1-20 MG-MCG(24) CHEW CHEW AND SWALLOW 1 TABLET BY MOUTH DAILY 28 tablet 11  . ondansetron (ZOFRAN) 4 MG tablet Take 1 tablet (4 mg total) by mouth every 6 (six) hours. (Patient not taking: Reported on 07/01/2018) 12 tablet 0   No current facility-administered medications for this visit.     Musculoskeletal: Strength & Muscle Tone: N/A Gait & Station: N/A Patient leans: N/A  Psychiatric Specialty Exam: ROS  There were no vitals taken for this visit.There is no height or weight on file to calculate BMI.  General Appearance: {Appearance:22683}  Eye Contact:  {BHH EYE CONTACT:22684}  Speech:  Clear and Coherent  Volume:  Normal  Mood:  {BHH MOOD:22306}  Affect:  {Affect (PAA):22687}  Thought Process:  Coherent  Orientation:  Full (Time, Place, and Person)  Thought Content:  Logical  Suicidal Thoughts:  {ST/HT (PAA):22692}  Homicidal Thoughts:  {ST/HT (PAA):22692}  Memory:  Immediate;   Good  Judgement:  {Judgement (PAA):22694}  Insight:  {Insight (PAA):22695}  Psychomotor Activity:  Normal  Concentration:  Concentration: Good and Attention Span: Good  Recall:  Good  Fund of Knowledge:Good  Language: Good  Akathisia:  No  Handed:  Right  AIMS (if indicated):  not done  Assets:  Communication Skills Desire for Improvement  ADL's:  Intact  Cognition: WNL  Sleep:  {BHH GOOD/FAIR/POOR:22877}   Screenings: PHQ2-9     Office Visit from 09/06/2018 in [facility] Office Visit from 07/15/2017 in [facility]  PHQ-2 Total Score  6  1  PHQ-9 Total Score  22  -      Assessment and Plan:  Assessment  Plan  The patient demonstrates the following risk factors for suicide: Chronic risk factors for suicide include: {Chronic Risk Factors for VELFYBO:17510258}. Acute risk factors for suicide include: {Acute Risk Factors for NIDPOEU:23536144}. Protective factors for this patient include: {Protective Factors for Suicide RXVQ:00867619}.  Considering these factors, the overall suicide risk at this point appears to be {Desc; low/moderate/high:110033}. Patient {ACTION; IS/IS JKD:32671245} appropriate for outpatient follow up.    Neysa Hotter, MD 9/23/202012:58 PM

## 2018-12-07 ENCOUNTER — Ambulatory Visit (HOSPITAL_COMMUNITY): Payer: Self-pay | Admitting: Psychiatry

## 2019-01-02 ENCOUNTER — Other Ambulatory Visit: Payer: Self-pay | Admitting: Family Medicine

## 2019-02-24 ENCOUNTER — Emergency Department (HOSPITAL_COMMUNITY): Payer: Managed Care, Other (non HMO)

## 2019-02-24 ENCOUNTER — Encounter (HOSPITAL_COMMUNITY): Payer: Self-pay | Admitting: *Deleted

## 2019-02-24 ENCOUNTER — Ambulatory Visit (INDEPENDENT_AMBULATORY_CARE_PROVIDER_SITE_OTHER)
Admission: EM | Admit: 2019-02-24 | Discharge: 2019-02-24 | Disposition: A | Discharge status: Home / Self Care | Payer: Managed Care, Other (non HMO) | Source: Home / Self Care

## 2019-02-24 ENCOUNTER — Other Ambulatory Visit: Payer: Self-pay

## 2019-02-24 ENCOUNTER — Emergency Department (HOSPITAL_COMMUNITY)
Admission: EM | Admit: 2019-02-24 | Discharge: 2019-02-24 | Disposition: A | Discharge status: Home / Self Care | Payer: Managed Care, Other (non HMO) | Attending: Emergency Medicine | Admitting: Emergency Medicine

## 2019-02-24 DIAGNOSIS — Z5321 Procedure and treatment not carried out due to patient leaving prior to being seen by health care provider: Secondary | ICD-10-CM | POA: Insufficient documentation

## 2019-02-24 DIAGNOSIS — I4510 Unspecified right bundle-branch block: Secondary | ICD-10-CM

## 2019-02-24 DIAGNOSIS — R0789 Other chest pain: Secondary | ICD-10-CM

## 2019-02-24 DIAGNOSIS — R9431 Abnormal electrocardiogram [ECG] [EKG]: Secondary | ICD-10-CM

## 2019-02-24 LAB — BASIC METABOLIC PANEL
Anion gap: 8 (ref 5–15)
BUN: 14 mg/dL (ref 6–20)
CO2: 25 mmol/L (ref 22–32)
Calcium: 8.7 mg/dL — ABNORMAL LOW (ref 8.9–10.3)
Chloride: 101 mmol/L (ref 98–111)
Creatinine, Ser: 0.63 mg/dL (ref 0.44–1.00)
GFR calc Af Amer: >60 mL/min (ref >60)
GFR calc non Af Amer: >60 mL/min (ref >60)
Glucose, Bld: 85 mg/dL (ref 70–99)
Potassium: 4.1 mmol/L (ref 3.5–5.1)
Sodium: 134 mmol/L — ABNORMAL LOW (ref 135–145)

## 2019-02-24 LAB — CBC
HCT: 45.1 % (ref 36.0–46.0)
Hemoglobin: 14.9 g/dL (ref 12.0–15.0)
MCH: 32.5 pg (ref 26.0–34.0)
MCHC: 33 g/dL (ref 30.0–36.0)
MCV: 98.5 fL (ref 80.0–100.0)
Platelets: 324 10*3/uL (ref 150–400)
RBC: 4.58 MIL/uL (ref 3.87–5.11)
RDW: 11.8 % (ref 11.5–15.5)
WBC: 10.9 10*3/uL — ABNORMAL HIGH (ref 4.0–10.5)
nRBC: 0 % (ref 0.0–0.2)

## 2019-02-24 LAB — TROPONIN I (HIGH SENSITIVITY): Troponin I (High Sensitivity): <2 ng/L (ref <18)

## 2019-02-24 NOTE — ED Provider Notes (Signed)
[facility]   [id number] 02/24/19 Arrival Time: 1536   CC: CHEST PAIN  SUBJECTIVE:  Sheila Fields is a 23 y.o. female who presents with complaint of intermittent chest pain x 7 days, that became constant today.  Denies a precipitating event, trauma, recent lower respiratory tract, or strenuous upper body activities. 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  Localizes chest pain to left chest and underneath LT breast.  Describes as worsening, that is constant, and dull in character.  Rates pain as 8/10.   Has tried 800 mg of ibuprofen today without relief.  Symptoms made worse with deep breath and coughing.  Reports pain in RT shoulder and arm as well.  Denies previous symptoms in the past.  Complains of lightheadedness, palpitations, tachycardia (120 bpm at work yesterday), SOB, night sweats, and anxiety.   Denies fever, chills, dizziness, nausea, vomiting, abdominal pain, changes in bowel or bladder habits, diaphoresis, numbness/tingling in extremities, peripheral edema.    Denies calf pain or swelling, recent long travel, recent surgery, pregnancy, malignancy, or previous blood clot  Admits to tobacco use 1/2 PPD x 3 years. Currently on [facility].   Maternal grandparents CHF; maternal grandparents with MI age 66  Previous cardiac testing: none.  ROS: As per HPI.  All other pertinent ROS negative.    Past Medical History:  Diagnosis Date  . Irregular bleeding 10/25/2012  . Sleep apnea    Past Surgical History:  Procedure Laterality Date  . ABDOMINAL SURGERY    . APPENDECTOMY    . BREAST SURGERY     breast cyst  . TONSILLECTOMY     No Known Allergies No current facility-administered medications on file prior to encounter.   Current Outpatient Medications on File Prior to Encounter  Medication Sig Dispense Refill  . escitalopram (LEXAPRO) 20 MG tablet TAKE 1/2 TABLET BY MOUTH EVERY MORNING FOR 7 DAYS THEN GO TO ONE TABLET EVERY MORNING  30 tablet 3  . ibuprofen (ADVIL,MOTRIN) 400 MG tablet Take 400 mg by mouth every 6 (six) hours as needed.    . Norethin Ace-Eth Estrad-FE 1-20 MG-MCG(24) CHEW CHEW AND SWALLOW 1 TABLET BY MOUTH DAILY 28 tablet 11   Social History   Socioeconomic History  . Marital status: Single    Spouse name: Not on file  . Number of children: Not on file  . Years of education: Not on file  . Highest education level: Not on file  Occupational History  . Not on file  Tobacco Use  . Smoking status: Current Every Day Smoker    Packs/day: 0.10    Years: 1.00    Pack years: 0.10    Types: Cigarettes  . Smokeless tobacco: Never Used  . Tobacco comment: smokes 3-4 cigs a day.  Substance and Sexual Activity  . Alcohol use: Yes    Comment: occ  . Drug use: No  . Sexual activity: Yes    Birth control/protection: Pill  Other Topics Concern  . Not on file  Social History Narrative  . Not on file   Social Determinants of Health   Financial Resource Strain:   . Difficulty of Paying Living Expenses: Not on file  Food Insecurity:   . Worried About Programme researcher, broadcasting/film/video in the Last Year: Not on file  . Ran Out of Food in the Last Year: Not on file  Transportation Needs:   . Lack of Transportation (Medical): Not on file  . Lack  of Transportation (Non-Medical): Not on file  Physical Activity:   . Days of Exercise per Week: Not on file  . Minutes of Exercise per Session: Not on file  Stress:   . Feeling of Stress : Not on file  Social Connections:   . Frequency of Communication with Friends and Family: Not on file  . Frequency of Social Gatherings with Friends and Family: Not on file  . Attends Religious Services: Not on file  . Active Member of Clubs or Organizations: Not on file  . Attends Archivist Meetings: Not on file  . Marital Status: Not on file  Intimate Partner Violence:   . Fear of Current or Ex-Partner: Not on file  . Emotionally Abused: Not on file  . Physically Abused:  Not on file  . Sexually Abused: Not on file   Family History  Problem Relation Age of Onset  . Cancer Other        breast-great aunt  . Stroke Other        mat great gand ma  . Diabetes Maternal Grandfather   . Heart disease Maternal Grandfather        MI     OBJECTIVE:  Vitals:   02/24/19 1612  BP: 123/80  Pulse: 96  Resp: 16  Temp: 98 F (36.7 C)  TempSrc: Oral  SpO2: 97%    General appearance: alert; no distress Eyes: PERRLA; EOMI; conjunctiva normal HENT: normocephalic; atraumatic; oropharynx clear Neck: supple; no carotid bruits Lungs: clear to auscultation bilaterally without adventitious breath sounds Heart: regular rate and rhythm.  Clear S1 and S2 without rubs, gallops, or murmur.  Radial pulses 2+ equal bilaterally Chest Wall: TTP over LT lateral chest wall; TTP with lateral compression of chest Abdomen: soft, non-tender; bowel sounds normal; no guarding Extremities: no cyanosis or edema; symmetrical with no gross deformities Skin: warm and dry Psychological: alert and cooperative; anxious mood and affect  ECG: Orders placed or performed during the hospital encounter of 02/24/19  . ED EKG  . ED EKG   EKG normal sinus rhythm without ST elevations, depressions, or prolonged PR interval.  No narrowing of the QRS complexes.  EKG significant for incomplete RBBB.  No past EKG on file for comparison.     ASSESSMENT & PLAN:  1. Other chest pain   2. Incomplete RBBB   3. Nonspecific abnormal electrocardiogram (ECG) (EKG)    Unable to rule out cardiac disease or blood clot in urgent care setting.  Offered patient further evaluation and management in the ED.  Patient is agreement.  Will go by private vehicle to [facility] ED for further work-up and evalution   Lestine Box, [place] 02/24/19 1632

## 2019-02-24 NOTE — ED Triage Notes (Signed)
Chest pain for a week 

## 2019-02-24 NOTE — Discharge Instructions (Signed)
Unable to rule out cardiac disease or blood clot in urgent care setting.  Offered patient further evaluation and management in the ED.  Patient is agreement.  Will go by private vehicle to [facility] ED for further work-up and evalution

## 2019-02-24 NOTE — ED Triage Notes (Signed)
Pt presents to UC w/ c/o chest pain x7 days.

## 2019-02-25 ENCOUNTER — Telehealth: Payer: Self-pay | Admitting: Family Medicine

## 2019-02-25 MED ORDER — NABUMETONE 750 MG PO TABS: ORAL_TABLET | ORAL | 0 refills | Status: DC

## 2019-02-25 NOTE — Telephone Encounter (Signed)
Pt contacted office and states that she is having chest pain that goes into left arm. Pt did go to Urgent Care yesterday; they recommened further work up at [place]. Pt went to ER but left after 5 hours of waiting. Pt had EKG and blood work done and pt states it was all normal. Spoke with provider. Provider requested message to be put pack. Pt will need work note for work. 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 Please advise. Thank you  [facility]

## 2019-02-25 NOTE — Telephone Encounter (Signed)
Pt did leave against med advice so my intervention less than ideal wbut with ekg and xray and bw wnl, willing to write w e for days missed and thru sund, add relafen 750 mg one bid for chest pain. Tell pt if symtoms persist or worsen she simply will have to go back to er to see thru the work up and eval

## 2019-02-25 NOTE — Addendum Note (Signed)
Addended by: Vicente Males on: 02/25/2019 11:05 AM   Modules accepted: Orders

## 2019-02-25 NOTE — Telephone Encounter (Signed)
Pt contacted and verbalized understanding. Medication sent to pharmacy.   Please sent work note through [employer]. Pt has called out of work for today and may return on Monday. Thank you

## 2019-02-28 ENCOUNTER — Encounter: Payer: Self-pay | Admitting: Family Medicine

## 2019-03-04 ENCOUNTER — Other Ambulatory Visit: Payer: Self-pay | Admitting: Family Medicine

## 2019-03-19 ENCOUNTER — Other Ambulatory Visit: Payer: Self-pay | Admitting: Family Medicine

## 2019-04-06 ENCOUNTER — Encounter: Payer: Self-pay | Admitting: Family Medicine

## 2019-04-07 ENCOUNTER — Encounter: Payer: Self-pay | Admitting: Family Medicine

## 2019-04-10 ENCOUNTER — Other Ambulatory Visit: Payer: Self-pay

## 2019-04-10 ENCOUNTER — Ambulatory Visit
Admission: EM | Admit: 2019-04-10 | Discharge: 2019-04-10 | Disposition: A | Discharge status: Home / Self Care | Payer: Managed Care, Other (non HMO) | Attending: Emergency Medicine | Admitting: Emergency Medicine

## 2019-04-10 DIAGNOSIS — Z20822 Contact with and (suspected) exposure to covid-19: Secondary | ICD-10-CM | POA: Diagnosis not present

## 2019-04-10 DIAGNOSIS — N10 Acute tubulo-interstitial nephritis: Secondary | ICD-10-CM | POA: Insufficient documentation

## 2019-04-10 LAB — POCT URINALYSIS DIP (MANUAL ENTRY)
Bilirubin, UA: NEGATIVE
Glucose, UA: NEGATIVE mg/dL
Leukocytes, UA: NEGATIVE
Nitrite, UA: POSITIVE — AB
Protein Ur, POC: 100 mg/dL — AB
Spec Grav, UA: >=1.03 — AB (ref 1.010–1.025)
Urobilinogen, UA: 0.2 E.U./dL
pH, UA: 6 (ref 5.0–8.0)

## 2019-04-10 LAB — POCT URINE PREGNANCY: Preg Test, Ur: NEGATIVE

## 2019-04-10 LAB — POC SARS CORONAVIRUS 2 AG -  ED: SARS Coronavirus 2 Ag: NEGATIVE

## 2019-04-10 MED ORDER — ACETAMINOPHEN 325 MG PO TABS
650.0000 mg | ORAL_TABLET | Freq: Once | ORAL | Status: AC
  Administered 2019-04-10: 12:00:00 650 mg via ORAL

## 2019-04-10 MED ORDER — LEVOFLOXACIN 750 MG PO TABS: 750.0000 mg | ORAL_TABLET | Freq: Every day | ORAL | 0 refills | Status: DC

## 2019-04-10 MED ORDER — ONDANSETRON 4 MG PO TBDP
4.0000 mg | ORAL_TABLET | Freq: Once | ORAL | Status: AC
  Administered 2019-04-10: 4 mg via ORAL

## 2019-04-10 NOTE — ED Provider Notes (Signed)
[facility]    CSN: [id number] Arrival date & time: 04/10/19  1118      History   Chief Complaint Chief Complaint  Patient presents with  . Fever  . Emesis  . Rt flank pain    HPI MELAT WRISLEY is a 24 y.o. female.   Complains of right flank pain, body aches, fever for the past 2 days.  She reported nausea and vomiting since yesterday.  Denies a precipitating event, or specific injury.  Patient localizes pain to to her right flank.  Describes it as  achy in Editor, commissioning.  Has tried OTC medications without relief.  Nothing aggravated her symptom.  Denies similar symptoms in the past.  Denies  chills, appetite change, weight change, chest pain,  changes in bowel or bladder habits.  The history is provided by the patient. No language interpreter was used.  Fever Associated symptoms: nausea and vomiting   Emesis Associated symptoms: fever     Past Medical History:  Diagnosis Date  . Irregular bleeding 10/25/2012  . Sleep apnea     Patient Active Problem List   Diagnosis Date Noted  . Hematuria 03/27/2013  . Kidney stone 03/27/2013  . Irregular bleeding 10/25/2012    Past Surgical History:  Procedure Laterality Date  . ABDOMINAL SURGERY    . APPENDECTOMY    . BREAST SURGERY     breast cyst  . TONSILLECTOMY      OB History    Gravida  1   Para  1   Term      Preterm      AB      Living  1     SAB      TAB      Ectopic      Multiple      Live Births  1            Home Medications    Prior to Admission medications   Medication Sig Start Date End Date Taking? Authorizing Provider  escitalopram (LEXAPRO) 20 MG tablet TAKE 1/2 TABLET BY MOUTH EVERY MORNING FOR 7 DAYS THEN GO TO ONE TABLET EVERY MORNING 01/05/19   Merlyn Albert, MD  ibuprofen (ADVIL,MOTRIN) 400 MG tablet Take 400 mg by mouth every 6 (six) hours as needed.    [provider]  nabumetone (RELAFEN) 750 MG tablet TAKE 1 TABLET BY MOUTH TWICE DAILY FOR  CHEST PAIN 03/09/19   Merlyn Albert, MD  Norethin Ace-Eth Estrad-FE 1-20 MG-MCG(24) CHEW CHEW AND SWALLOW 1 TABLET BY MOUTH DAILY 07/28/18   Merlyn Albert, MD    Family History Family History  Problem Relation Age of Onset  . Cancer Other        breast-great aunt  . Stroke Other        mat great gand ma  . Diabetes Maternal Grandfather   . Heart disease Maternal Grandfather        MI    Social History Social History   Tobacco Use  . Smoking status: Current Every Day Smoker    Packs/day: 0.10    Years: 1.00    Pack years: 0.10    Types: Cigarettes  . Smokeless tobacco: Never Used  . Tobacco comment: smokes 3-4 cigs a day.  Substance Use Topics  . Alcohol use: Yes    Comment: occ  . Drug use: No     Allergies   Patient has no known allergies.   Review of Systems Review  of Systems  Constitutional: Positive for fever.  HENT: Negative.   Respiratory: Negative.   Cardiovascular: Negative.   Gastrointestinal: Positive for nausea and vomiting.  Genitourinary: Positive for flank pain.  All other systems reviewed and are negative.    Physical Exam Triage Vital Signs ED Triage Vitals  Enc Vitals Group     BP 04/10/19 1124 116/63     Pulse Rate 04/10/19 1124 (!) 127     Resp 04/10/19 1124 20     Temp 04/10/19 1124 (!) 100.5 F (38.1 C)     Temp Source 04/10/19 1124 Oral     SpO2 04/10/19 1124 98 %     Weight --      Height --      Head Circumference --      Peak Flow --      Pain Score 04/10/19 1131 8     Pain Loc --      Pain Edu? --      Excl. in GC? --    No data found.  Updated Vital Signs BP 116/63 (BP Location: Right Arm)   Pulse (!) 127   Temp (!) 100.5 F (38.1 C) (Oral)   Resp 20   LMP 03/13/2019 (Approximate) Comment: oral contraceptive  SpO2 98%   Visual Acuity Right Eye Distance:   Left Eye Distance:   Bilateral Distance:    Right Eye Near:   Left Eye Near:    Bilateral Near:     Physical Exam Vitals and nursing note  reviewed.  Constitutional:      General: She is not in acute distress.    Appearance: Normal appearance. She is normal weight. She is not ill-appearing or toxic-appearing.  HENT:     Head: Normocephalic.     Right Ear: Tympanic membrane, ear canal and external ear normal. There is no impacted cerumen.     Left Ear: Tympanic membrane, ear canal and external ear normal. There is no impacted cerumen.     Nose: Nose normal. No congestion.     Mouth/Throat:     Mouth: Mucous membranes are moist.     Pharynx: Oropharynx is clear. No oropharyngeal exudate or posterior oropharyngeal erythema.  Cardiovascular:     Rate and Rhythm: Normal rate and regular rhythm.     Pulses: Normal pulses.     Heart sounds: Normal heart sounds. No murmur.  Pulmonary:     Effort: Pulmonary effort is normal. No respiratory distress.     Breath sounds: Normal breath sounds. No wheezing or rhonchi.  Chest:     Chest wall: No tenderness.  Abdominal:     General: Abdomen is flat. Bowel sounds are normal. There is no distension.     Palpations: There is no mass.     Tenderness: There is no abdominal tenderness. There is no right CVA tenderness, left CVA tenderness, guarding or rebound.     Hernia: No hernia is present.     Comments: Right flank pain is present  Skin:    Capillary Refill: Capillary refill takes less than 2 seconds.  Neurological:     General: No focal deficit present.     Mental Status: She is alert and oriented to person, place, and time.      UC Treatments / Results  Labs (all labs ordered are listed, but only abnormal results are displayed) Labs Reviewed  POCT URINALYSIS DIP (MANUAL ENTRY) - Abnormal; Notable for the following components:      Result Value  Color, UA straw (*)    Clarity, UA cloudy (*)    Ketones, POC UA large (80) (*)    Spec Grav, UA >=1.030 (*)    Blood, UA large (*)    Protein Ur, POC =100 (*)    Nitrite, UA Positive (*)    All other components within normal  limits  URINE CULTURE  NOVEL CORONAVIRUS, NAA  POCT URINE PREGNANCY  POC SARS CORONAVIRUS 2 AG -  ED    EKG   Radiology No results found.  Procedures Procedures (including critical care time)  Medications Ordered in UC Medications  acetaminophen (TYLENOL) tablet 650 mg (650 mg Oral Given 04/10/19 1147)  ondansetron (ZOFRAN-ODT) disintegrating tablet 4 mg (4 mg Oral Given 04/10/19 1147)    Initial Impression / Assessment and Plan / UC Course  I have reviewed the triage vital signs and the nursing notes.  Pertinent labs & imaging results that were available during my care of the patient were reviewed by me and considered in my medical decision making (see chart for details).    POC urine analysis test was ordered.  Result was positive for large red blood cell, large protein, and positive for nitrite. POC COVID-19 test was ordered and result was negative POC pregnancy test was negative  Patient will be treated for possible pyelonephritis Levaquin will be prescribed OTC Tylenol given in office Take OTC ibuprofen or Tylenol for fever Advised patient to go to the ER for worsening of symptoms  Final Clinical Impressions(s) / UC Diagnoses   Final diagnoses:  Acute pyelonephritis     Discharge Instructions     POC COVID-19 test was negative POC urine analysis was positive for nitrite, large blood cell and protein Tylenol given in office for fever Urine culture sent.  We will call you with the results.   Push fluids and get plenty of rest.   Take antibiotic as directed and to completion Take pyridium as prescribed and as needed for symptomatic relief Follow up with PCP if symptoms persists Go to ER if you have any new or worsening symptoms such as fever, worsening abdominal pain, nausea/vomiting, flank pain     ED Prescriptions    None     PDMP not reviewed this encounter.   Emerson Monte, [place] 04/10/19 1246

## 2019-04-10 NOTE — Discharge Instructions (Addendum)
POC COVID-19 test was negative POC urine analysis was positive for nitrite, large blood cell and protein POC pregnancy test was negative Tylenol given in office for fever Urine culture sent.  We will call you with the results.   Push fluids and get plenty of rest.   Take antibiotic as directed and to completion Take pyridium as prescribed and as needed for symptomatic relief Follow up with PCP if symptoms persists Go to ER if you have any new or worsening symptoms such as fever, worsening abdominal pain, nausea/vomiting, flank pain

## 2019-04-10 NOTE — ED Triage Notes (Signed)
Pt presents to UC w/ c/o right flank pain, fever, body aches x2 days. Nausea and vomiting since yesterday. Pt states she feels bloated. Hx of kidney stones.

## 2019-04-11 LAB — NOVEL CORONAVIRUS, NAA: SARS-CoV-2, NAA: NOT DETECTED

## 2019-04-12 LAB — URINE CULTURE: Culture: >=100000 — AB

## 2019-04-13 ENCOUNTER — Ambulatory Visit: Payer: Self-pay | Admitting: Psychiatry

## 2019-05-03 ENCOUNTER — Ambulatory Visit (HOSPITAL_COMMUNITY): Payer: Self-pay | Admitting: Psychiatry
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
## 2019-05-07 ENCOUNTER — Other Ambulatory Visit: Payer: Self-pay | Admitting: Family Medicine

## 2019-05-09 ENCOUNTER — Encounter (HOSPITAL_COMMUNITY): Payer: Self-pay | Admitting: Psychiatry

## 2019-05-09 ENCOUNTER — Ambulatory Visit (INDEPENDENT_AMBULATORY_CARE_PROVIDER_SITE_OTHER): Payer: 59 | Admitting: Psychiatry

## 2019-05-09 ENCOUNTER — Other Ambulatory Visit: Payer: Self-pay

## 2019-05-09 DIAGNOSIS — F321 Major depressive disorder, single episode, moderate: Secondary | ICD-10-CM | POA: Diagnosis not present

## 2019-05-09 NOTE — Patient Instructions (Signed)
1. Reinitiate lexapro 10 mg daily for one week, then 20 mg daily  2. Referral to therapy  3. Next appointment: 4/12 at 11:20
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
## 2019-06-20 ENCOUNTER — Encounter (HOSPITAL_COMMUNITY): Payer: Self-pay | Admitting: Psychiatry

## 2019-06-20 ENCOUNTER — Other Ambulatory Visit: Payer: Self-pay

## 2019-06-20 ENCOUNTER — Ambulatory Visit (INDEPENDENT_AMBULATORY_CARE_PROVIDER_SITE_OTHER): Payer: 59 | Admitting: Psychiatry

## 2019-06-20 DIAGNOSIS — F320 Major depressive disorder, single episode, mild: Secondary | ICD-10-CM | POA: Diagnosis not present

## 2019-06-20 MED ORDER — ESCITALOPRAM OXALATE 20 MG PO TABS: 20.0000 mg | ORAL_TABLET | Freq: Every day | ORAL | 1 refills | Status: DC

## 2019-06-20 NOTE — Patient Instructions (Signed)
1. Continue lexapro 20 mg daily  2. Referral to therapy  3. Next appointment: 4/12 at 11:20

## 2019-07-12 ENCOUNTER — Other Ambulatory Visit: Payer: Self-pay | Admitting: Family Medicine

## 2019-07-12 NOTE — Telephone Encounter (Signed)
Three mo worth. P e with carolyn or dr Ladona Ridgel in 2.5 months
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
## 2019-08-01 ENCOUNTER — Telehealth (INDEPENDENT_AMBULATORY_CARE_PROVIDER_SITE_OTHER): Payer: 59 | Admitting: Psychiatry

## 2019-08-01 ENCOUNTER — Encounter (HOSPITAL_COMMUNITY): Payer: Self-pay | Admitting: Psychiatry

## 2019-08-01 ENCOUNTER — Other Ambulatory Visit: Payer: Self-pay

## 2019-08-01 DIAGNOSIS — F321 Major depressive disorder, single episode, moderate: Secondary | ICD-10-CM

## 2019-08-01 MED ORDER — BUPROPION HCL ER (XL) 150 MG PO TB24: 150.0000 mg | ORAL_TABLET | Freq: Every day | ORAL | 1 refills | Status: DC

## 2019-08-01 MED ORDER — ESCITALOPRAM OXALATE 20 MG PO TABS: 20.0000 mg | ORAL_TABLET | Freq: Every day | ORAL | 1 refills | Status: DC

## 2019-08-01 NOTE — Patient Instructions (Signed)
1. Continue lexapro 20 mg daily  2. Start bupropion 150 mg daily  2. Next appointment: 7/14 at 10:30
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
## 2019-09-21 ENCOUNTER — Telehealth (INDEPENDENT_AMBULATORY_CARE_PROVIDER_SITE_OTHER): Payer: 59 | Admitting: Psychiatry

## 2019-09-21 ENCOUNTER — Other Ambulatory Visit: Payer: Self-pay

## 2019-09-21 ENCOUNTER — Encounter (HOSPITAL_COMMUNITY): Payer: Self-pay | Admitting: Psychiatry

## 2019-09-21 DIAGNOSIS — F324 Major depressive disorder, single episode, in partial remission: Secondary | ICD-10-CM

## 2019-09-21 MED ORDER — BUPROPION HCL ER (XL) 150 MG PO TB24: 150.0000 mg | ORAL_TABLET | Freq: Every day | ORAL | 0 refills | Status: DC

## 2019-09-21 MED ORDER — ESCITALOPRAM OXALATE 20 MG PO TABS: 20.0000 mg | ORAL_TABLET | Freq: Every day | ORAL | 0 refills | Status: DC

## 2019-09-21 NOTE — Patient Instructions (Signed)
1.Continue lexapro20 mg daily  2. Continue bupropion 150 mg daily  2. Next appointment: 9/29 at 9:10

## 2019-10-18 ENCOUNTER — Ambulatory Visit: Payer: Self-pay

## 2019-11-01 ENCOUNTER — Other Ambulatory Visit: Payer: 59
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
## 2019-12-07 ENCOUNTER — Telehealth (HOSPITAL_COMMUNITY): Payer: 59 | Admitting: Psychiatry

## 2021-03-09 ENCOUNTER — Emergency Department (HOSPITAL_COMMUNITY)
Admission: EM | Admit: 2021-03-09 | Discharge: 2021-03-09 | Discharge status: Against Medical Advice | Payer: 59 | Attending: Emergency Medicine | Admitting: Emergency Medicine

## 2021-03-09 DIAGNOSIS — R1030 Lower abdominal pain, unspecified: Secondary | ICD-10-CM

## 2021-03-09 NOTE — ED Notes (Signed)
Patient told ED staff that she over-reacted and did not want to be seen. Left ED before patient could be triaged.

## 2023-08-11 ENCOUNTER — Ambulatory Visit: Admitting: Physician Assistant

## 2023-08-11 ENCOUNTER — Encounter: Payer: Self-pay | Admitting: Physician Assistant

## 2023-08-11 VITALS — BP 99/56 | HR 80 | Temp 98.0°F | Ht 64.0 in | Wt 136.0 lb

## 2023-08-11 DIAGNOSIS — Z7689 Persons encountering health services in other specified circumstances: Secondary | ICD-10-CM

## 2023-08-11 DIAGNOSIS — G4730 Sleep apnea, unspecified: Secondary | ICD-10-CM | POA: Diagnosis not present

## 2023-08-11 NOTE — Progress Notes (Signed)
 New Patient Office Visit  Subjective    Patient ID: Sheila Fields, female    DOB: 01-12-1996  Age: 28 y.o. MRN: [id number]  CC:  Chief Complaint  Patient presents with   New Patient (Initial Visit)    Would like referral to ENT. Dx with sleep apnea in past.  Has gotten worse over the years.     HPI Sheila Fields presents to establish care  Patient presents today with past medical history significant for sleep apnea. She is does not currently take daily medications, and is currently pregnant. She reports being diagnosed with sleep apnea in ~2009. Diagnosis followed by tonsillectomy and some sort of nasal surgery. She reports sleep apnea gets worse with pregnancies. She states her partner reports increased snoring and episodes of apnea. She endorses frequent headaches and daytime drowsiness.   Outpatient Encounter Medications as of 08/11/2023  Medication Sig   [DISCONTINUED] buPROPion  (WELLBUTRIN  XL) 150 MG 24 hr tablet Take 1 tablet (150 mg total) by mouth daily. (Patient not taking: Reported on 08/11/2023)   [DISCONTINUED] escitalopram  (LEXAPRO ) 20 MG tablet Take 1 tablet (20 mg total) by mouth daily. (Patient not taking: Reported on 08/11/2023)   [DISCONTINUED] ibuprofen  (ADVIL ,MOTRIN ) 400 MG tablet Take 400 mg by mouth every 6 (six) hours as needed. (Patient not taking: Reported on 08/11/2023)   [DISCONTINUED] levofloxacin  (LEVAQUIN ) 750 MG tablet Take 1 tablet (750 mg total) by mouth daily. (Patient not taking: Reported on 08/11/2023)   [DISCONTINUED] nabumetone  (RELAFEN ) 750 MG tablet TAKE 1 TABLET BY MOUTH TWICE DAILY FOR CHEST PAIN (Patient not taking: Reported on 08/11/2023)   [DISCONTINUED] Norethin  Ace-Eth Estrad-FE 1-20 MG-MCG(24) CHEW CHEW AND SWALLOW 1 TABLET BY MOUTH DAILY (Patient not taking: Reported on 08/11/2023)   No facility-administered encounter medications on file as of 08/11/2023.    Past Medical History:  Diagnosis Date   Irregular bleeding 10/25/2012   Sleep apnea      Past Surgical History:  Procedure Laterality Date   ABDOMINAL SURGERY     APPENDECTOMY     BREAST SURGERY     breast cyst   TONSILLECTOMY      Family History  Problem Relation Age of Onset   Cancer Other        breast-great aunt   Stroke Other        mat great gand ma   Diabetes Maternal Grandfather    Heart disease Maternal Grandfather        MI   Schizophrenia Maternal Grandmother    Depression Father    Drug abuse Father    Alcohol abuse Father    Depression Mother    Drug abuse Mother    Bipolar disorder Maternal Aunt     Social History   Socioeconomic History   Marital status: Single    Spouse name: Not on file   Number of children: Not on file   Years of education: Not on file   Highest education level: Not on file  Occupational History   Not on file  Tobacco Use   Smoking status: Former    Current packs/day: 0.10    Average packs/day: 0.1 packs/day for 1 year (0.1 ttl pk-yrs)    Types: Cigarettes   Smokeless tobacco: Never   Tobacco comments:    smokes 3-4 cigs a day.  Vaping Use   Vaping status: Never Used  Substance and Sexual Activity   Alcohol use: Yes    Comment: occ   Drug use: No  Sexual activity: Yes    Birth control/protection: Pill  Other Topics Concern   Not on file  Social History Narrative   Not on file   Social Drivers of Health   Financial Resource Strain: Not on file  Food Insecurity: Not on file  Transportation Needs: Not on file  Physical Activity: Not on file  Stress: Not on file  Social Connections: Not on file  Intimate Partner Violence: Not on file    Review of Systems  Constitutional:  Positive for malaise/fatigue. Negative for fever and weight loss.  Neurological:  Positive for headaches.  Psychiatric/Behavioral:  Negative for depression. The patient is not nervous/anxious.         Objective    BP (!) 99/56   Pulse 80   Temp 98 F (36.7 C)   Ht 5\' 4"  (1.626 m)   Wt 136 lb (61.7 kg)   SpO2 100%    BMI 23.34 kg/m   Physical Exam Constitutional:      Appearance: Normal appearance.  HENT:     Head: Normocephalic.     Mouth/Throat:     Mouth: Mucous membranes are moist.     Pharynx: Oropharynx is clear.  Eyes:     Extraocular Movements: Extraocular movements intact.     Conjunctiva/sclera: Conjunctivae normal.  Cardiovascular:     Rate and Rhythm: Normal rate and regular rhythm.     Heart sounds: Normal heart sounds. No murmur heard. Pulmonary:     Effort: Pulmonary effort is normal.     Breath sounds: Normal breath sounds. No wheezing or rales.  Skin:    General: Skin is warm and dry.  Neurological:     General: No focal deficit present.     Mental Status: She is alert and oriented to person, place, and time.  Psychiatric:        Mood and Affect: Mood normal.        Behavior: Behavior normal.       Assessment & Plan:  Encounter to establish care  Sleep apnea, unspecified type Assessment & Plan: Patient presents today with distant history of sleep apnea. Symptoms worse recently with current pregnancy. She does not currently have comorbidities increasing her risk for sleep apnea. Referral to neurology to set up home sleep study. We discussed sleep hygiene and healthy diet and exercise. Warning signs and return precautions discussed. Patient to follow up in approximately 6 months for yearly physical.   Orders: -     Ambulatory referral to Neurology    Return in about 6 months (around 02/10/2024) for phys .   Jearlean Mince Jaydalyn Demattia, PA-C

## 2023-08-11 NOTE — Assessment & Plan Note (Signed)
 Patient presents today with distant history of sleep apnea. Symptoms worse recently with current pregnancy. She does not currently have comorbidities increasing her risk for sleep apnea. Referral to neurology to set up home sleep study. We discussed sleep hygiene and healthy diet and exercise. Warning signs and return precautions discussed. Patient to follow up in approximately 6 months for yearly physical.

## 2023-09-04 ENCOUNTER — Encounter: Payer: Self-pay | Admitting: Physician Assistant

## 2023-10-06 ENCOUNTER — Encounter: Payer: Self-pay | Admitting: Neurology

## 2023-10-06 ENCOUNTER — Ambulatory Visit: Admitting: Neurology

## 2023-10-06 VITALS — BP 121/73 | HR 92 | Ht 66.0 in | Wt 148.0 lb

## 2023-10-06 DIAGNOSIS — R351 Nocturia: Secondary | ICD-10-CM

## 2023-10-06 DIAGNOSIS — R519 Headache, unspecified: Secondary | ICD-10-CM

## 2023-10-06 DIAGNOSIS — Z3490 Encounter for supervision of normal pregnancy, unspecified, unspecified trimester: Secondary | ICD-10-CM

## 2023-10-06 DIAGNOSIS — Z8669 Personal history of other diseases of the nervous system and sense organs: Secondary | ICD-10-CM | POA: Diagnosis not present

## 2023-10-06 DIAGNOSIS — G4719 Other hypersomnia: Secondary | ICD-10-CM

## 2023-10-06 DIAGNOSIS — R0683 Snoring: Secondary | ICD-10-CM | POA: Diagnosis not present

## 2023-10-06 NOTE — Patient Instructions (Signed)

## 2023-10-06 NOTE — Progress Notes (Signed)
 Subjective:    Patient ID: Sheila Fields is a 28 y.o. female.  HPI    True Mar, MD, PhD [facility] [address] [address] [place], [place] [address]  Dear [name],   I saw your patient, Sheila Fields, upon your kind request in my sleep clinic today for initial consultation of her sleep disorder, in particular, concern for underlying obstructive sleep apnea.  The patient is unaccompanied today.  As you know, Sheila Fields is a 28 year old female, currently pregnant in the second trimester, with an underlying benign medical history, who reports snoring and excessive daytime somnolence and a diagnosis of obstructive sleep apnea as a child.  She had tonsillectomy and nasal surgery at age 89.  She has not been on CPAP therapy.  She reports a family history of sleep apnea affecting her father and paternal grandmother.  Her Epworth sleepiness score is 16 out of 24, fatigue severity score is 48 out of 63.  I reviewed your office note from 08/11/2023.  Patient reports a bedtime of around 11 and rise time around 7:30 AM.  She has nocturia about once or twice per average night and has had occasional morning headaches.  She lives with her significant other and 3 children, ages 69, 93 and 2.  The 27-year-old sleeps in a toddler bed in her bedroom.  She drinks caffeine in the form of coffee and soda, about 2 servings altogether per day.  She quit smoking about 3 years ago.  She does not currently drink any alcohol.  She had a laboratory sleep study as a child but currently would prefer a home sleep test.  Her Past Medical History Is Significant For: Past Medical History:  Diagnosis Date   Irregular bleeding 10/25/2012   Sleep apnea     Her Past Surgical History Is Significant For: Past Surgical History:  Procedure Laterality Date   ABDOMINAL SURGERY     APPENDECTOMY     BREAST SURGERY     breast cyst   NASAL SINUS SURGERY     done at 28 years old with T&A  for sleep apnea, pt states there was a bone out of place   TONSILLECTOMY     adenoidectomy    Her Family History Is Significant For: Family History  Problem Relation Age of Onset   Depression Mother    Drug abuse Mother    Depression Father    Drug abuse Father    Alcohol abuse Father    Sleep apnea Father    Schizophrenia Maternal Grandmother    Diabetes Maternal Grandfather    Heart disease Maternal Grandfather        MI   Sleep apnea Paternal Grandmother    Bipolar disorder Maternal Aunt    Cancer Other        breast-great aunt   Stroke Other        mat great gand ma    Her Social History Is Significant For: Social History   Socioeconomic History   Marital status: Significant Other    Spouse name: Not on file   Number of children: 3   Years of education: Not on file   Highest education level: Not on file  Occupational History   Not on file  Tobacco Use   Smoking status: Former    Current packs/day: 0.10    Average packs/day: 0.1 packs/day for 1 year (0.1 ttl pk-yrs)    Types: Cigarettes   Smokeless tobacco: Never  Tobacco comments:    smokes 3-4 cigs a day.  Vaping Use   Vaping status: Never Used  Substance and Sexual Activity   Alcohol use: Not Currently    Comment: occ   Drug use: No   Sexual activity: Yes    Birth control/protection: Pill  Other Topics Concern   Not on file  Social History Narrative   Right handed   Lives with boyfriend (father of her children) and her children   Caffeine: no more than 2 cups while pregnant (if not pregnant, usually 3 cups/day)   Social Drivers of Corporate investment banker Strain: Not on file  Food Insecurity: Not on file  Transportation Needs: Not on file  Physical Activity: Not on file  Stress: Not on file  Social Connections: Not on file    Her Allergies Are:  No Known Allergies:   Her Current Medications Are:  No outpatient encounter medications on file as of 10/06/2023.   No  facility-administered encounter medications on file as of 10/06/2023.  :   Review of Systems:  Out of a complete 14 point review of systems, all are reviewed and negative with the exception of these symptoms as listed below:  Review of Systems  Neurological:        Patient is here alone for sleep consult. She states she was diagnosed with sleep apnea at 28 years old. She had nasal surgery, tonsillectomy and adenoidectomy. Over the years the sleep apnea has come back. She endorses snoring, apnea, daytime sleepiness, and headaches. She states it gets worse with each of her pregnancies. She is currently pregnant with her 4th child and is due in November. She does not want any further surgeries. Her goal is to get a cpap machine. ESS 16 FSS 48    Objective:  Neurological Exam  Physical Exam Physical Examination:   Vitals:   10/06/23 1121  BP: 121/73  Pulse: 92    General Examination: The patient is a very pleasant 28 y.o. female in no acute distress. She appears well-developed and well-nourished and well groomed.   HEENT: Normocephalic, atraumatic, pupils are equal, round and reactive to light, extraocular tracking is good without limitation to gaze excursion or nystagmus noted. Hearing is grossly intact. Face is symmetric with normal facial animation. Speech is clear with no dysarthria noted. There is no hypophonia. There is no lip, neck/head, jaw or voice tremor. Neck is supple with full range of passive and active motion. There are no carotid bruits on auscultation. Oropharynx exam reveals: mild mouth dryness, good dental hygiene and no significant airway crowding other than small airway entry, Mallampati class II, tonsils absent, uvula smaller.  Mild overbite noted.  Tongue protrudes centrally and palate elevates symmetrically, nasal inspection reveals narrow nasal passages but no significantly deviated septum.  No significant inferior turbinate hypertrophy noted.  Neck circumference 13  inches.  Chest: Clear to auscultation without wheezing, rhonchi or crackles noted.  Heart: S1+S2+0, regular and normal without murmurs, rubs or gallops noted.   Abdomen: pregnant.  Extremities: There is no pitting edema in the distal lower extremities bilaterally.   Skin: Warm and dry without trophic changes noted.   Musculoskeletal: exam reveals no obvious joint deformities.   Neurologically:  Mental status: The patient is awake, alert and oriented in all 4 spheres. Her immediate and remote memory, attention, language skills and fund of knowledge are appropriate. There is no evidence of aphasia, agnosia, apraxia or anomia. Speech is clear with normal prosody and  enunciation. Thought process is linear. Mood is normal and affect is normal.  Cranial nerves II - XII are as described above under HEENT exam.  Motor exam: Normal bulk, moving all 4 extremities without restriction, no obvious action or resting tremor.  Fine motor skills and coordination: grossly intact.  Cerebellar testing: No dysmetria or intention tremor. There is no truncal or gait ataxia.  Sensory exam: intact to light touch in the upper and lower extremities.  Gait, station and balance: She stands easily. No veering to one side is noted. No leaning to one side is noted. Posture is age-appropriate and stance is narrow based. Gait shows normal stride length and normal pace. No problems turning are noted.   Assessment and Plan:  In summary, Addilynn E Munn is a very pleasant 28 year old female, currently pregnant in the second trimester, with an underlying benign medical history, except prior diagnosis of sleep apnea as a child and prior smoking, whose history and physical exam are concerning for sleep disordered breathing, particularly obstructive sleep apnea (OSA).  While a laboratory attended sleep study is typically considered gold standard for evaluation of sleep disordered breathing, the patient would prefer a home sleep  test at this time.   I had a long chat with the patient about my findings and the diagnosis of sleep apnea, particularly OSA, its prognosis and treatment options. We talked about medical/conservative treatments, surgical interventions and non-pharmacological approaches for symptom control. I explained, in particular, the risks and ramifications of untreated moderate to severe OSA, especially with respect to developing cardiovascular disease down the road, including congestive heart failure (CHF), difficult to treat hypertension, cardiac arrhythmias (particularly A-fib), neurovascular complications including TIA, stroke and dementia. Even type 2 diabetes has, in part, been linked to untreated OSA. Symptoms of untreated OSA may include (but may not be limited to) daytime sleepiness, nocturia (i.e. frequent nighttime urination), memory problems, mood irritability and suboptimally controlled or worsening mood disorder such as depression and/or anxiety, lack of energy, lack of motivation, physical discomfort, as well as recurrent headaches, especially morning or nocturnal headaches. We talked about the importance of maintaining a healthy lifestyle and striving for healthy weight. I recommended a sleep study at this time. I outlined the differences between a laboratory attended sleep study which is considered more comprehensive and accurate over the option of a home sleep test (HST); the latter may lead to underestimation of sleep disordered breathing in some instances and does not help with diagnosing upper airway resistance syndrome and is not accurate enough to diagnose primary central sleep apnea typically. I outlined possible surgical and non-surgical treatment options of OSA, including the use of a positive airway pressure (PAP) device (i.e. CPAP, AutoPAP/APAP or BiPAP in certain circumstances), a custom-made dental device (aka oral appliance, which would require a referral to a specialist dentist or orthodontist  typically, and is generally speaking not considered for patients with full dentures or edentulous state), upper airway surgical options, such as traditional UPPP (which is not considered a first-line treatment) or the Inspire device (hypoglossal nerve stimulator, which would involve a referral for consultation with an ENT surgeon, after careful selection, following inclusion criteria - also not first-line treatment). I explained the PAP treatment option to the patient in detail, as this is generally considered first-line treatment.  The patient indicated that she would be willing to try PAP therapy, if the need arises. I explained the importance of being compliant with PAP treatment, not only for insurance purposes but primarily to improve  patient's symptoms symptoms, and for the patient's long term health benefit, including to reduce Her cardiovascular risks longer-term.    We will pick up our discussion about the next steps and treatment options after testing.  We will keep her posted as to the test results by phone call and/or MyChart messaging where possible.  We will plan to follow-up in sleep clinic accordingly as well.  I answered all her questions today and the patient was in agreement.   I encouraged her to call with any interim questions, concerns, problems or updates or email us  through MyChart.  Generally speaking, sleep test authorizations may take up to 2 weeks, sometimes less, sometimes longer, the patient is encouraged to get in touch with us  if they do not hear back from the sleep lab staff directly within the next 2 weeks.  Thank you very much for allowing me to participate in the care of this nice patient. If I can be of any further assistance to you please do not hesitate to call me at [phone].  Sincerely,   True Mar, MD, PhD

## 2023-10-23 ENCOUNTER — Ambulatory Visit (INDEPENDENT_AMBULATORY_CARE_PROVIDER_SITE_OTHER): Admitting: Neurology

## 2023-10-23 DIAGNOSIS — R351 Nocturia: Secondary | ICD-10-CM

## 2023-10-23 DIAGNOSIS — Z8669 Personal history of other diseases of the nervous system and sense organs: Secondary | ICD-10-CM

## 2023-10-23 DIAGNOSIS — G4733 Obstructive sleep apnea (adult) (pediatric): Secondary | ICD-10-CM | POA: Diagnosis not present

## 2023-10-23 DIAGNOSIS — G4719 Other hypersomnia: Secondary | ICD-10-CM

## 2023-10-23 DIAGNOSIS — R519 Headache, unspecified: Secondary | ICD-10-CM

## 2023-10-23 DIAGNOSIS — R0683 Snoring: Secondary | ICD-10-CM

## 2023-10-23 DIAGNOSIS — Z3490 Encounter for supervision of normal pregnancy, unspecified, unspecified trimester: Secondary | ICD-10-CM

## 2023-11-05 NOTE — Progress Notes (Unsigned)
   [facility]  HOME SLEEP TEST (SANSA) REPORT (Mail-Out Device):   STUDY DATE: 10/27/2023  DOB: 1995-05-18  MRN: [id number]  ORDERING CLINICIAN: True Mar, MD, PhD   REFERRING CLINICIAN: Grooms, [name], [place]   CLINICAL INFORMATION/HISTORY (obtained from visit note dated 10/06/2023): 28 year old female, currently pregnant in the second trimester, with an underlying benign medical history, who reports snoring and excessive daytime somnolence and a diagnosis of obstructive sleep apnea as a child.   PATIENT'S LAST REPORTED EPWORTH SLEEPINESS SCORE (ESS): 16/24.  BMI (at the time of sleep clinic visit and/or test date): 23.9 kg/m  FINDINGS:   Study Protocol:    The SANSA single-point-of-skin-contact chest-worn sensor - an FDA cleared and DOT approved type 4 home sleep test device - measures eight physiological channels,  including blood oxygen saturation (measured via PPG [photoplethysmography]), EKG-derived heart rate, respiratory effort, chest movement (measured via accelerometer), snoring, body position, and actigraphy. The device is designed to be worn for up to 10 hours per study.   Sleep Summary:   Total Recording Time (hours, min): *** hours, *** min  Total Effective Sleep Time (hours, min):  *** hours, *** min  Sleep Efficiency (%):    ***%   Respiratory Indices:   Calculated sAHI (per hour):  ***/hour         Oxygen Saturation Statistics:    Oxygen Saturation (%) Mean: ***%   Minimum oxygen saturation (%):                 ***%   O2 Saturation Range (%): *** - ***%   Time below or at 88% saturation: *** min   Pulse Rate Statistics:   Pulse Mean (bpm):    ***/min    Pulse Range (*** - ***/min)   Snoring: ***   IMPRESSION/DIAGNOSES:   OSA (obstructive sleep apnea), ***  ***Nocturnal Hypoxemia  RECOMMENDATIONS:   ***   INTERPRETING PHYSICIAN:   True Mar, MD, PhD Medical Director, [facility]) Diplomat, ABPN (Neurology and Sleep)   [facility] [address], [address] [place], [place] [address] [phone]

## 2023-11-13 ENCOUNTER — Ambulatory Visit: Payer: Self-pay | Admitting: Neurology

## 2023-11-13 NOTE — Procedures (Signed)
   [facility]  HOME SLEEP TEST (SANSA) REPORT (Mail-Out Device):   STUDY DATE: 10/27/2023  DOB: [birth year]  MRN: [id number]  ORDERING CLINICIAN: True Mar, MD, PhD   REFERRING CLINICIAN: Grooms, [name], [place]   CLINICAL INFORMATION/HISTORY (obtained from visit note dated 10/06/2023): 28 year old female, currently pregnant in the second trimester, with an underlying benign medical history, who reports snoring and excessive daytime somnolence and a diagnosis of obstructive sleep apnea as a child.   PATIENT'S LAST REPORTED EPWORTH SLEEPINESS SCORE (ESS): 16/24.  BMI (at the time of sleep clinic visit and/or test date): 23.9 kg/m  FINDINGS:   Study Protocol:    The SANSA single-point-of-skin-contact chest-worn sensor - an FDA cleared and DOT approved type 4 home sleep test device - measures eight physiological channels,  including blood oxygen saturation (measured via PPG [photoplethysmography]), EKG-derived heart rate, respiratory effort, chest movement (measured via accelerometer), snoring, body position, and actigraphy. The device is designed to be worn for up to 10 hours per study.   Sleep Summary:   Total Recording Time (hours, min): 8 hours, 17 min  Total Effective Sleep Time (hours, min):  7 hours, 18 min  Sleep Efficiency (%):    88%   Respiratory Indices:   Calculated sAHI (per hour):  3.7/hour         Oxygen Saturation Statistics:    Oxygen Saturation (%) Mean: 94.1%   Minimum oxygen saturation (%):                 82.8%   O2 Saturation Range (%): 82.8-98.6%   Time below or at 88% saturation: 1 min   Pulse Rate Statistics:   Pulse Mean (bpm):    85/min    Pulse Range (71-122/min)   Snoring: Mild, intermittent  IMPRESSION/DIAGNOSES:   Primary snoring\   RECOMMENDATIONS:   This home sleep test does not demonstrate any significant obstructive or central sleep disordered breathing with a total AHI of less than 5/hour.  Her total  AHI was 3.7/hour without any recurrent or significant desaturations noted, no significant time below 89% saturation for the night (1 minute).  Intermittent mild snoring was detected. Treatment with a positive airway pressure device such as AutoPap or CPAP is not indicated based on this test.  Snoring may improve with avoidance of the supine sleep position. For disturbing snoring, an oral appliance through dentistry or orthodontics can be considered.  Other causes of the patient's symptoms, including circadian rhythm disturbances, an underlying mood disorder, medication effect and/or an underlying medical problem cannot be ruled out based on this test. Clinical correlation is recommended.  The patient should be cautioned not to drive, work at heights, or operate dangerous or heavy equipment when tired or sleepy. Review and reiteration of good sleep hygiene measures should be pursued with any patient. The patient will be advised to follow up with her referring provider, who will be notified of the test results.   I certify that I have reviewed the raw data recording prior to the issuance of this report in accordance with the standards of the American Academy of Sleep Medicine (AASM).    INTERPRETING PHYSICIAN:   True Mar, MD, PhD Medical Director, [facility]) Diplomat, ABPN (Neurology and Sleep)   [facility] [address] [place], [place] [address] [phone]

## 2024-02-10 ENCOUNTER — Encounter: Admitting: Physician Assistant
